# Patient Record
Sex: Female | Born: 1960 | Race: White | Hispanic: No | Marital: Single | Smoking: Never smoker
Health system: Southern US, Community
[De-identification: ages and names within clinical notes are randomized; demographics above are authoritative.]

## PROBLEM LIST (undated history)

## (undated) DIAGNOSIS — I10 Essential (primary) hypertension: Secondary | ICD-10-CM

---

## 2020-01-11 ENCOUNTER — Emergency Department
Admission: EM | Admit: 2020-01-11 | Discharge: 2020-01-11 | Disposition: A | Discharge status: Home / Self Care | Payer: No Typology Code available for payment source | Attending: Emergency Medicine | Admitting: Emergency Medicine

## 2020-01-11 ENCOUNTER — Emergency Department: Payer: No Typology Code available for payment source

## 2020-01-11 ENCOUNTER — Other Ambulatory Visit: Payer: Self-pay

## 2020-01-11 DIAGNOSIS — Y999 Unspecified external cause status: Secondary | ICD-10-CM | POA: Diagnosis not present

## 2020-01-11 DIAGNOSIS — Y929 Unspecified place or not applicable: Secondary | ICD-10-CM | POA: Insufficient documentation

## 2020-01-11 DIAGNOSIS — Y92410 Unspecified street and highway as the place of occurrence of the external cause: Secondary | ICD-10-CM | POA: Diagnosis not present

## 2020-01-11 DIAGNOSIS — R0789 Other chest pain: Secondary | ICD-10-CM | POA: Diagnosis not present

## 2020-01-11 DIAGNOSIS — Y9389 Activity, other specified: Secondary | ICD-10-CM | POA: Insufficient documentation

## 2020-01-11 MED ORDER — ACETAMINOPHEN 160 MG/5ML PO SOLN
500.0000 mg | Freq: Once | ORAL | Status: AC
  Administered 2020-01-11: 23:00:00 500 mg via ORAL
  Filled 2020-01-11: qty 20.3

## 2020-01-11 NOTE — Discharge Instructions (Signed)
Take Tylenol at home for discomfort.
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
## 2022-03-01 ENCOUNTER — Telehealth: Payer: Self-pay | Admitting: Gerontology

## 2022-03-01 NOTE — Telephone Encounter (Signed)
Schedule a new patient appointment on 6/28 at 1pm. Unable to leave a message. Patient voicemail box is full.

## 2022-03-09 ENCOUNTER — Ambulatory Visit: Payer: Self-pay | Admitting: Gerontology

## 2022-04-05 ENCOUNTER — Ambulatory Visit: Payer: Self-pay | Admitting: Gerontology

## 2024-05-16 ENCOUNTER — Emergency Department

## 2024-05-16 ENCOUNTER — Inpatient Hospital Stay
Admission: EM | Admit: 2024-05-16 | Discharge: 2024-05-23 | DRG: 641 | Disposition: A | Discharge status: Skilled Nursing Facility | Attending: Internal Medicine | Admitting: Internal Medicine

## 2024-05-16 ENCOUNTER — Other Ambulatory Visit: Payer: Self-pay

## 2024-05-16 DIAGNOSIS — N179 Acute kidney failure, unspecified: Secondary | ICD-10-CM | POA: Diagnosis present

## 2024-05-16 DIAGNOSIS — C50919 Malignant neoplasm of unspecified site of unspecified female breast: Secondary | ICD-10-CM | POA: Diagnosis present

## 2024-05-16 DIAGNOSIS — E8809 Other disorders of plasma-protein metabolism, not elsewhere classified: Secondary | ICD-10-CM | POA: Diagnosis present

## 2024-05-16 DIAGNOSIS — L97519 Non-pressure chronic ulcer of other part of right foot with unspecified severity: Secondary | ICD-10-CM | POA: Diagnosis present

## 2024-05-16 DIAGNOSIS — E871 Hypo-osmolality and hyponatremia: Secondary | ICD-10-CM | POA: Diagnosis present

## 2024-05-16 DIAGNOSIS — L97529 Non-pressure chronic ulcer of other part of left foot with unspecified severity: Secondary | ICD-10-CM | POA: Diagnosis present

## 2024-05-16 DIAGNOSIS — Z560 Unemployment, unspecified: Secondary | ICD-10-CM | POA: Diagnosis not present

## 2024-05-16 DIAGNOSIS — R627 Adult failure to thrive: Secondary | ICD-10-CM | POA: Diagnosis present

## 2024-05-16 DIAGNOSIS — R7989 Other specified abnormal findings of blood chemistry: Secondary | ICD-10-CM | POA: Diagnosis present

## 2024-05-16 DIAGNOSIS — L0390 Cellulitis, unspecified: Secondary | ICD-10-CM

## 2024-05-16 DIAGNOSIS — D649 Anemia, unspecified: Secondary | ICD-10-CM | POA: Diagnosis present

## 2024-05-16 DIAGNOSIS — R591 Generalized enlarged lymph nodes: Secondary | ICD-10-CM | POA: Diagnosis present

## 2024-05-16 DIAGNOSIS — E876 Hypokalemia: Secondary | ICD-10-CM | POA: Diagnosis present

## 2024-05-16 DIAGNOSIS — K219 Gastro-esophageal reflux disease without esophagitis: Secondary | ICD-10-CM | POA: Diagnosis present

## 2024-05-16 DIAGNOSIS — E8729 Other acidosis: Secondary | ICD-10-CM | POA: Diagnosis present

## 2024-05-16 DIAGNOSIS — I10 Essential (primary) hypertension: Secondary | ICD-10-CM | POA: Diagnosis present

## 2024-05-16 DIAGNOSIS — D509 Iron deficiency anemia, unspecified: Secondary | ICD-10-CM | POA: Diagnosis present

## 2024-05-16 DIAGNOSIS — R531 Weakness: Principal | ICD-10-CM

## 2024-05-16 DIAGNOSIS — L304 Erythema intertrigo: Secondary | ICD-10-CM | POA: Diagnosis present

## 2024-05-16 DIAGNOSIS — K449 Diaphragmatic hernia without obstruction or gangrene: Secondary | ICD-10-CM | POA: Diagnosis present

## 2024-05-16 DIAGNOSIS — D72829 Elevated white blood cell count, unspecified: Secondary | ICD-10-CM | POA: Diagnosis present

## 2024-05-16 DIAGNOSIS — T730XXA Starvation, initial encounter: Secondary | ICD-10-CM | POA: Diagnosis present

## 2024-05-16 DIAGNOSIS — R630 Anorexia: Secondary | ICD-10-CM | POA: Diagnosis present

## 2024-05-16 HISTORY — DX: Essential (primary) hypertension: I10

## 2024-05-16 LAB — BETA-HYDROXYBUTYRIC ACID: Beta-Hydroxybutyric Acid: 5.07 mmol/L — ABNORMAL HIGH (ref 0.05–0.27)

## 2024-05-16 LAB — COMPREHENSIVE METABOLIC PANEL WITH GFR
ALT: 17 U/L (ref 0–44)
AST: 33 U/L (ref 15–41)
Albumin: 2.8 g/dL — ABNORMAL LOW (ref 3.5–5.0)
Alkaline Phosphatase: 71 U/L (ref 38–126)
Anion gap: 23 — ABNORMAL HIGH (ref 5–15)
BUN: 32 mg/dL — ABNORMAL HIGH (ref 8–23)
CO2: 14 mmol/L — ABNORMAL LOW (ref 22–32)
Calcium: 8.8 mg/dL — ABNORMAL LOW (ref 8.9–10.3)
Chloride: 95 mmol/L — ABNORMAL LOW (ref 98–111)
Creatinine, Ser: 1.45 mg/dL — ABNORMAL HIGH (ref 0.44–1.00)
GFR, Estimated: 41 mL/min — ABNORMAL LOW (ref >60)
Glucose, Bld: 91 mg/dL (ref 70–99)
Potassium: 4.5 mmol/L (ref 3.5–5.1)
Sodium: 132 mmol/L — ABNORMAL LOW (ref 135–145)
Total Bilirubin: 1.6 mg/dL — ABNORMAL HIGH (ref 0.0–1.2)
Total Protein: 7.5 g/dL (ref 6.5–8.1)

## 2024-05-16 LAB — BLOOD GAS, VENOUS
Acid-base deficit: 5.2 mmol/L — ABNORMAL HIGH (ref 0.0–2.0)
Bicarbonate: 18.9 mmol/L — ABNORMAL LOW (ref 20.0–28.0)
O2 Saturation: 19.6 %
Patient temperature: 37
pCO2, Ven: 32 mmHg — ABNORMAL LOW (ref 44–60)
pH, Ven: 7.38 (ref 7.25–7.43)
pO2, Ven: <31 mmHg — CL (ref 32–45)

## 2024-05-16 LAB — TROPONIN I (HIGH SENSITIVITY)
Troponin I (High Sensitivity): 57 ng/L — ABNORMAL HIGH (ref <18)
Troponin I (High Sensitivity): 64 ng/L — ABNORMAL HIGH (ref <18)

## 2024-05-16 LAB — LACTIC ACID, PLASMA
Lactic Acid, Venous: 1.4 mmol/L (ref 0.5–1.9)
Lactic Acid, Venous: 1.7 mmol/L (ref 0.5–1.9)

## 2024-05-16 LAB — CBC
HCT: 27.1 % — ABNORMAL LOW (ref 36.0–46.0)
Hemoglobin: 7.5 g/dL — ABNORMAL LOW (ref 12.0–15.0)
MCH: 19.9 pg — ABNORMAL LOW (ref 26.0–34.0)
MCHC: 27.7 g/dL — ABNORMAL LOW (ref 30.0–36.0)
MCV: 72.1 fL — ABNORMAL LOW (ref 80.0–100.0)
Platelets: 631 K/uL — ABNORMAL HIGH (ref 150–400)
RBC: 3.76 MIL/uL — ABNORMAL LOW (ref 3.87–5.11)
RDW: 19.9 % — ABNORMAL HIGH (ref 11.5–15.5)
WBC: 17.2 K/uL — ABNORMAL HIGH (ref 4.0–10.5)
nRBC: 0 % (ref 0.0–0.2)

## 2024-05-16 LAB — TSH: TSH: 1.978 u[IU]/mL (ref 0.350–4.500)

## 2024-05-16 LAB — MAGNESIUM: Magnesium: 2 mg/dL (ref 1.7–2.4)

## 2024-05-16 LAB — PROTIME-INR
INR: 1.1 (ref 0.8–1.2)
Prothrombin Time: 15 s (ref 11.4–15.2)

## 2024-05-16 LAB — T4, FREE: Free T4: 1.28 ng/dL — ABNORMAL HIGH (ref 0.61–1.12)

## 2024-05-16 MED ORDER — SODIUM CHLORIDE 0.9 % IV SOLN
2.0000 g | Freq: Once | INTRAVENOUS | Status: AC
  Administered 2024-05-16: 2 g via INTRAVENOUS
  Filled 2024-05-16: qty 12.5

## 2024-05-16 MED ORDER — ONDANSETRON HCL 4 MG/2ML IJ SOLN: 4.0000 mg | Freq: Four times a day (QID) | INTRAMUSCULAR | Status: DC | PRN

## 2024-05-16 MED ORDER — DEXTROSE-SODIUM CHLORIDE 5-0.9 % IV SOLN: INTRAVENOUS | Status: DC

## 2024-05-16 MED ORDER — SODIUM CHLORIDE 0.9 % IV SOLN
2.0000 g | INTRAVENOUS | Status: DC
  Administered 2024-05-16 – 2024-05-19 (×4): 2 g via INTRAVENOUS
  Filled 2024-05-16 (×4): qty 20

## 2024-05-16 MED ORDER — FOLIC ACID 1 MG PO TABS
1.0000 mg | ORAL_TABLET | Freq: Every day | ORAL | Status: DC
  Administered 2024-05-16 – 2024-05-23 (×7): 1 mg via ORAL
  Filled 2024-05-16 (×7): qty 1

## 2024-05-16 MED ORDER — ONDANSETRON HCL 4 MG PO TABS: 4.0000 mg | ORAL_TABLET | Freq: Four times a day (QID) | ORAL | Status: DC | PRN

## 2024-05-16 MED ORDER — METRONIDAZOLE 500 MG/100ML IV SOLN
500.0000 mg | Freq: Once | INTRAVENOUS | Status: AC
  Administered 2024-05-16: 500 mg via INTRAVENOUS
  Filled 2024-05-16: qty 100

## 2024-05-16 MED ORDER — ENOXAPARIN SODIUM 40 MG/0.4ML IJ SOSY
40.0000 mg | PREFILLED_SYRINGE | INTRAMUSCULAR | Status: DC
  Administered 2024-05-16: 40 mg via SUBCUTANEOUS
  Filled 2024-05-16: qty 0.4

## 2024-05-16 MED ORDER — THIAMINE HCL 100 MG/ML IJ SOLN
100.0000 mg | Freq: Once | INTRAMUSCULAR | Status: AC
  Administered 2024-05-16: 100 mg via INTRAVENOUS
  Filled 2024-05-16: qty 2

## 2024-05-16 MED ORDER — IOHEXOL 350 MG/ML SOLN
80.0000 mL | Freq: Once | INTRAVENOUS | Status: AC | PRN
  Administered 2024-05-16: 80 mL via INTRAVENOUS

## 2024-05-16 MED ORDER — ADULT MULTIVITAMIN W/MINERALS CH
1.0000 | ORAL_TABLET | Freq: Every day | ORAL | Status: DC
  Administered 2024-05-16 – 2024-05-23 (×7): 1 via ORAL
  Filled 2024-05-16 (×7): qty 1

## 2024-05-16 MED ORDER — METRONIDAZOLE 500 MG/100ML IV SOLN
500.0000 mg | Freq: Two times a day (BID) | INTRAVENOUS | Status: DC
  Administered 2024-05-17: 500 mg via INTRAVENOUS
  Filled 2024-05-16 (×2): qty 100

## 2024-05-16 MED ORDER — DEXTROSE 5 % AND 0.9 % NACL IV BOLUS
500.0000 mL | Freq: Once | INTRAVENOUS | Status: AC
  Administered 2024-05-16: 500 mL via INTRAVENOUS

## 2024-05-16 MED ORDER — VANCOMYCIN HCL IN DEXTROSE 1-5 GM/200ML-% IV SOLN: 1000.0000 mg | Freq: Once | INTRAVENOUS | Status: DC

## 2024-05-16 MED ORDER — MORPHINE SULFATE (PF) 2 MG/ML IV SOLN
2.0000 mg | INTRAVENOUS | Status: DC | PRN
  Filled 2024-05-16: qty 1

## 2024-05-16 NOTE — ED Triage Notes (Signed)
 Patient to ED initially for neck pain stating she has been sleeping in a wooden chair; during triage patient experienced that she has been very weak recently and community advocate reports that patient has not been eating or drinking.

## 2024-05-16 NOTE — H&P (Signed)
 History and Physical    Patient: Cathy Grant DOB: 01-Jan-1961 DOA: 05/16/2024 DOS: the patient was seen and examined on 05/16/2024 PCP: Patient, No Pcp Per  Patient coming from: Home  Chief Complaint:  Chief Complaint  Patient presents with   Weakness   HPI: Cathy Grant is a 63 y.o. female with medical history significant of essential hypertension, suspected breast cancer, GERD who has not been on any medications at home but has apparently been gradually declining not eating or drinking for weeks.  She reports all foods do not taste right so she is cut down on her intake.  She came to the ER complaint of neck pain because she has been sitting in the chair.  No abdominal pain.  She had nausea but no vomiting no diarrhea.  Patient was seen and evaluated in the ER.  Patient found to have ketoacidosis, multiple electrolyte abnormalities and anemia.  She appears to have starvation ketosis.  Her imaging showed severe hiatal hernia with the anterior stomach move into the chest cavity.  Patient also has some AKI, elevated troponin, leukocytosis among other things.  CT abdomen pelvis also showed possible enterocolitis.  Patient also has diffuse axillary and subpectoral adenopathy no obvious breast mass.  Patient has been admitted for further evaluation and treatment.  Review of Systems: As mentioned in the history of present illness. All other systems reviewed and are negative. Past Medical History:  Diagnosis Date   Hypertension    History reviewed. No pertinent surgical history. Social History:  reports that she has never smoked. She has never used smokeless tobacco. She reports that she does not drink alcohol and does not use drugs.  No Known Allergies  History reviewed. No pertinent family history.  Prior to Admission medications   Not on File    Physical Exam: Vitals:   05/16/24 1149 05/16/24 1430 05/16/24 1530 05/16/24 1600  BP: 136/80 (!) 100/56 100/62 (!) 91/55   Pulse: (!) 109 100 95 100  Resp: 18  18 18   Temp: 97.8 F (36.6 C)     TempSrc: Oral     SpO2: 100% 100% 100% 100%   Constitutional: Chronically ill looking, NAD, calm, comfortable Eyes: PERRL, lids and conjunctivae normal ENMT: Mucous membranes are dry. Posterior pharynx clear of any exudate or lesions.Normal dentition.  Neck: normal, supple, no masses, no thyromegaly Respiratory: clear to auscultation bilaterally, no wheezing, no crackles. Normal respiratory effort. No accessory muscle use.  Cardiovascular: Sinus tachycardia, no murmurs / rubs / gallops. No extremity edema. 2+ pedal pulses. No carotid bruits.  Abdomen: no tenderness, no masses palpated. No hepatosplenomegaly. Bowel sounds positive.  Musculoskeletal: Good range of motion, no joint swelling or tenderness, Skin: no rashes, lesions, ulcers. No induration Neurologic: CN 2-12 grossly intact. Sensation intact, DTR normal. Strength 5/5 in all 4.  Psychiatric: Normal judgment and insight. Alert and oriented x 3. Normal mood  Data Reviewed:  Temperature 97.9, blood pressure 90/55, pulse 109, white count 17.2 hemoglobin 7.5 and platelets 631.  Sodium 132, BUN 32 creatinine 1.45 and chloride 95.  Calcium 8.8.  Beta-hydroxybutyrate acid 5.07.  INR 1.1 T4 is 1.28 but TSH 1.978.  EKG showed sinus tachycardia x-ray of the right leg showed plantar dislocation of the second digit middle phalanx at the PIP joint okay also metatarsus primus versus and hallux valgus, x-ray of the left foot shows no radiographic evidence of acute osteomyelitis many contractures CT abdomen pelvis shows fluid throughout the small bowel and colon with occasional areas  of enhancement.  Findings may represent enterocolitis.  Also large hiatal hernia with entire stomach intrathoracic.  This also contains pancreatic body and tail.  There is skin thickening and increased density in the inferior left breast.  Mammography recommended.  Also posterior uterine body mass  measures 7 x 7.6 x 5.6 cm typical of fibroid.  There are multiple cysts subcapsular hypodensity in the lateral spleen head CT without contrast showed no acute findings  Assessment and Plan:  #1 starvation ketosis: Patient has not being having adequate oral intake as a result of probably severe hiatal hernia with anterior stomach currently in the thoracic cavity.  She is not having significant respiratory problems.  Patient will be admitted.  Initiate D5 normal saline.  Encourage increased oral intake.  We will address the hiatal hernia.  Continue with close monitoring.  #2 severe hiatal hernia: Patient may require surgical consult in the morning.  PPIs for now.  Her inability to eat adequately could be related to the hiatal hernia.  #3 microcytic anemia: Could be nutritional.  Hemoglobin 7.5.  Follow closely.  Will transfuse if hemoglobin drops to less than 7.  #4 leukocytosis: Suspicion for infectious source including possible cellulitis versus osteomyelitis.  Initiate empiric antibiotics.  #5 essential hypertension: Not on any blood pressure medications.  Will initiate medications if the blood pressure rises.  #6 possible metastatic breast cancer: Patient will need mammogram and possible biopsy.  Diffuse lymphadenopathy could be due to other causes as well.  #7 AKI: Probably prerenal.  Hydrate and monitor  #8 hyponatremia: Continue saline and monitor sodium  #9 failure to thrive in adult: Due to poor oral intake.  Continue to monitor and improve nutrition.    Advance Care Planning:   Code Status: Full Code   Consults: None at the moment but will need surgical consult probably  Family Communication: Daughter and granddaughter at bedside  Severity of Illness: The appropriate patient status for this patient is INPATIENT. Inpatient status is judged to be reasonable and necessary in order to provide the required intensity of service to ensure the patient's safety. The patient's presenting  symptoms, physical exam findings, and initial radiographic and laboratory data in the context of their chronic comorbidities is felt to place them at high risk for further clinical deterioration. Furthermore, it is not anticipated that the patient will be medically stable for discharge from the hospital within 2 midnights of admission.   * I certify that at the point of admission it is my clinical judgment that the patient will require inpatient hospital care spanning beyond 2 midnights from the point of admission due to high intensity of service, high risk for further deterioration and high frequency of surveillance required.*  AuthorBETHA SIM KNOLL, MD 05/16/2024 6:46 PM  For on call review [email or web address].

## 2024-05-16 NOTE — ED Provider Notes (Signed)
 Clinical Course as of 05/16/24 2232  Thu May 16, 2024  1501 Received sign out, general weakness, will need admission. Concern for insiduous malignancy.  [HD]  1610 GFR, Estimated(!): 41 [HD]  1610 Patient is receiving a small amount of fluid with D5 [HD]  1817 CT ABDOMEN PELVIS W CONTRAST Possible breast cancer [HD]  1817 CT Angio Chest PE W and/or Wo Contrast Metastatic disease [HD]  1835 Admitted to hospitalist [HD]    Clinical Course User Index [HD] Nicholaus Rolland BRAVO, MD      Nicholaus Rolland BRAVO, MD 05/16/24 2232

## 2024-05-16 NOTE — ED Notes (Signed)
 Pt placed in clean gown and mesh undergarments all pt clothing placed in bag.

## 2024-05-16 NOTE — ED Notes (Signed)
 Patient crying in triage and requesting not to have blood drawn at this time; wishes to wait for IV stick.

## 2024-05-16 NOTE — ED Provider Notes (Signed)
 [facility] Provider Note    Event Date/Time   First MD Initiated Contact with Patient 05/16/24 1308     (approximate)   History   Weakness   HPI  Cathy Grant is a 63 y.o. female who comes in with weakness.  Patient reports decreased p.o. intake.  Patient reports not being on any medications.  She states that she just has not been able to eat or drink over the past few days, weeks.  She reports that the food just does not taste right.  She does report hitting her head and hurting her neck and a few days ago and now feeling like her neck is bent forward.  She reports no severe cough or belly pain.     Physical Exam   Triage Vital Signs: ED Triage Vitals [05/16/24 1149]  Encounter Vitals Group     BP 136/80     Girls Systolic BP Percentile      Girls Diastolic BP Percentile      Boys Systolic BP Percentile      Boys Diastolic BP Percentile      Pulse Rate (!) 109     Resp 18     Temp 97.8 F (36.6 C)     Temp Source Oral     SpO2 100 %     Weight      Height      Head Circumference      Peak Flow      Pain Score 0     Pain Loc      Pain Education      Exclude from Growth Chart     Most recent vital signs: Vitals:   05/16/24 1149  BP: 136/80  Pulse: (!) 109  Resp: 18  Temp: 97.8 F (36.6 C)  SpO2: 100%     General: Awake, no distress.  CV:  Good peripheral perfusion.  Resp:  Normal effort.  Abd:  No distention.  Soft and nontender Other:  Kyphosis noted of her neck.  With some tenderness along her C-spine. Patient has some wounds noted on her bilateral feet little bit of drainage noted.  She is a little bit of redness noted as well.  She got 2+ edema.  ED Results / Procedures / Treatments   Labs (all labs ordered are listed, but only abnormal results are displayed) Labs Reviewed  COMPREHENSIVE METABOLIC PANEL WITH GFR  CBC  URINALYSIS, ROUTINE W REFLEX MICROSCOPIC  CBG MONITORING, ED     EKG  My interpretation  of EKG:  Sinus tachycardia rate of 111 without any ST elevation or T wave versions, normal intervals  Radiology : pending   PROCEDURES:  Critical Care performed: Yes, see critical care procedure note(s)  .1-3 Lead EKG Interpretation  Performed by: Ernest Ronal BRAVO, MD Authorized by: Ernest Ronal BRAVO, MD     Interpretation: abnormal     ECG rate:  100   ECG rate assessment: tachycardic     Rhythm: sinus tachycardia     Ectopy: none     Conduction: normal   .Critical Care  Performed by: Ernest Ronal BRAVO, MD Authorized by: Ernest Ronal BRAVO, MD   Critical care provider statement:    Critical care time (minutes):  30   Critical care was necessary to treat or prevent imminent or life-threatening deterioration of the following conditions:  Sepsis   Critical care was time spent personally by me on the following activities:  Development of treatment plan with  patient or surrogate, discussions with consultants, evaluation of patient's response to treatment, examination of patient, ordering and review of laboratory studies, ordering and review of radiographic studies, ordering and performing treatments and interventions, pulse oximetry, re-evaluation of patient's condition and review of old charts    MEDICATIONS ORDERED IN ED: Medications - No data to display   IMPRESSION / MDM / ASSESSMENT AND PLAN / ED COURSE  I reviewed the triage vital signs and the nursing notes.   Patient's presentation is most consistent with acute presentation with potential threat to life or bodily function.    Patient comes in with failure to thrive in a 63 year old.  Patient appears very cachectic with wounds noted on her feet and significant edema.  Patient met sepsis criteria with elevated heart rate and elevated white count.  Will cover patient with antibiotics for possible osteomyelitis, cellulitis.  X-rays ordered to evaluate the feet.  Patient's thyroid slightly elevated but unlikely to be causing the symptoms.   Her CMP does show significantly low bicarb with elevated anion gap although her glucose was normal at 90.  I am concerned about a starvation ketosis.  Her albumin level is very low which would be consistent with her swelling.  Her hemoglobin is also low at 7.5.  Given she reports trauma to her head and neck pain I will get CT imaging to evaluate for head and neck from us  concern the possibility of occult cancer and will get CTs of her chest abdomen pelvis.  Do feel like patient will require admission to the hospital after these results  Patient denies any alcohol use but out of precaution I will give her a dose of thiamine  before starting her on D5 normal saline.  Patient handed off to oncoming team pending CT imaging and admission to the hospital  The patient is on the cardiac monitor to evaluate for evidence of arrhythmia and/or significant heart rate changes.  Clinical Course as of 05/16/24 1516  Thu May 16, 2024  1501 Received sign out, general weakness, will need admission. Concern for insiduous malignancy.  [HD]    Clinical Course User Index [HD] Nicholaus Rolland BRAVO, MD     FINAL CLINICAL IMPRESSION(S) / ED DIAGNOSES   Final diagnoses:  Weakness  Cellulitis, unspecified cellulitis site     Rx / DC Orders   ED Discharge Orders     None        Note:  This document was prepared using Dragon voice recognition software and may include unintentional dictation errors.   Ernest Ronal BRAVO, MD 05/16/24 [id number]
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
## 2024-05-16 NOTE — ED Notes (Signed)
 Pt remains in CT

## 2024-05-16 NOTE — Consult Note (Signed)
 CODE SEPSIS - PHARMACY COMMUNICATION  **Broad Spectrum Antibiotics should be administered within 1 hour of Sepsis diagnosis**  Time Code Sepsis Called/Page Received: 1519  Antibiotics Ordered: Cefepime  2G x1, Metronidazole  500 mg x1, Vancomycin  1G x1   Time of 1st antibiotic administration: 1714  Additional action taken by pharmacy: Messaged RN, she states they are waiting on IV team to get access draw BC so we can start antibiotics.   Annabella LOISE Banks, PharmD Clinical Pharmacist  05/16/2024  3:26 PM

## 2024-05-17 DIAGNOSIS — E876 Hypokalemia: Secondary | ICD-10-CM

## 2024-05-17 DIAGNOSIS — L0390 Cellulitis, unspecified: Secondary | ICD-10-CM | POA: Diagnosis not present

## 2024-05-17 DIAGNOSIS — R627 Adult failure to thrive: Secondary | ICD-10-CM | POA: Diagnosis not present

## 2024-05-17 DIAGNOSIS — N179 Acute kidney failure, unspecified: Secondary | ICD-10-CM | POA: Diagnosis not present

## 2024-05-17 DIAGNOSIS — E871 Hypo-osmolality and hyponatremia: Secondary | ICD-10-CM

## 2024-05-17 DIAGNOSIS — T730XXA Starvation, initial encounter: Secondary | ICD-10-CM | POA: Diagnosis not present

## 2024-05-17 LAB — COMPREHENSIVE METABOLIC PANEL WITH GFR
ALT: 12 U/L (ref 0–44)
AST: 20 U/L (ref 15–41)
Albumin: 1.8 g/dL — ABNORMAL LOW (ref 3.5–5.0)
Alkaline Phosphatase: 54 U/L (ref 38–126)
Anion gap: 9 (ref 5–15)
BUN: 24 mg/dL — ABNORMAL HIGH (ref 8–23)
CO2: 22 mmol/L (ref 22–32)
Calcium: 7.5 mg/dL — ABNORMAL LOW (ref 8.9–10.3)
Chloride: 101 mmol/L (ref 98–111)
Creatinine, Ser: 1.19 mg/dL — ABNORMAL HIGH (ref 0.44–1.00)
GFR, Estimated: 51 mL/min — ABNORMAL LOW (ref >60)
Glucose, Bld: 110 mg/dL — ABNORMAL HIGH (ref 70–99)
Potassium: 2.8 mmol/L — ABNORMAL LOW (ref 3.5–5.1)
Sodium: 132 mmol/L — ABNORMAL LOW (ref 135–145)
Total Bilirubin: 0.6 mg/dL (ref 0.0–1.2)
Total Protein: 5.3 g/dL — ABNORMAL LOW (ref 6.5–8.1)

## 2024-05-17 LAB — ABO/RH: ABO/RH(D): O POS

## 2024-05-17 LAB — CBC
HCT: 18.7 % — ABNORMAL LOW (ref 36.0–46.0)
Hemoglobin: 5.5 g/dL — ABNORMAL LOW (ref 12.0–15.0)
MCH: 20.3 pg — ABNORMAL LOW (ref 26.0–34.0)
MCHC: 29.4 g/dL — ABNORMAL LOW (ref 30.0–36.0)
MCV: 69 fL — ABNORMAL LOW (ref 80.0–100.0)
Platelets: 523 K/uL — ABNORMAL HIGH (ref 150–400)
RBC: 2.71 MIL/uL — ABNORMAL LOW (ref 3.87–5.11)
RDW: 19.4 % — ABNORMAL HIGH (ref 11.5–15.5)
WBC: 8.5 K/uL (ref 4.0–10.5)
nRBC: 0 % (ref 0.0–0.2)

## 2024-05-17 LAB — IRON AND TIBC
Iron: <10 ug/dL — ABNORMAL LOW (ref 28–170)
TIBC: 211 ug/dL — ABNORMAL LOW (ref 250–450)

## 2024-05-17 LAB — FOLATE: Folate: 5.8 ng/mL — ABNORMAL LOW (ref >5.9)

## 2024-05-17 LAB — HIV ANTIBODY (ROUTINE TESTING W REFLEX): HIV Screen 4th Generation wRfx: NONREACTIVE

## 2024-05-17 LAB — PREPARE RBC (CROSSMATCH)

## 2024-05-17 MED ORDER — NYSTATIN 100000 UNIT/GM EX POWD
Freq: Three times a day (TID) | CUTANEOUS | Status: DC
  Filled 2024-05-17: qty 15

## 2024-05-17 MED ORDER — SODIUM CHLORIDE 0.9% IV SOLUTION: Freq: Once | INTRAVENOUS | Status: AC

## 2024-05-17 MED ORDER — TRAMADOL HCL 50 MG PO TABS
50.0000 mg | ORAL_TABLET | Freq: Four times a day (QID) | ORAL | Status: DC | PRN
  Administered 2024-05-22: 50 mg via ORAL
  Filled 2024-05-17: qty 1

## 2024-05-17 MED ORDER — ACETAMINOPHEN 325 MG PO TABS: 650.0000 mg | ORAL_TABLET | Freq: Four times a day (QID) | ORAL | Status: DC | PRN

## 2024-05-17 MED ORDER — MEDIHONEY WOUND/BURN DRESSING EX PSTE
1.0000 | PASTE | Freq: Every day | CUTANEOUS | Status: DC
  Administered 2024-05-18 – 2024-05-23 (×6): 1 via TOPICAL
  Filled 2024-05-17: qty 44

## 2024-05-17 MED ORDER — FOLIC ACID 1 MG PO TABS: 1.0000 mg | ORAL_TABLET | Freq: Every day | ORAL | Status: DC

## 2024-05-17 MED ORDER — THIAMINE MONONITRATE 100 MG PO TABS
100.0000 mg | ORAL_TABLET | Freq: Every day | ORAL | Status: DC
  Administered 2024-05-17 – 2024-05-23 (×6): 100 mg via ORAL
  Filled 2024-05-17 (×6): qty 1

## 2024-05-17 MED ORDER — MORPHINE SULFATE (PF) 2 MG/ML IV SOLN: 2.0000 mg | INTRAVENOUS | Status: DC | PRN

## 2024-05-17 MED ORDER — SENNOSIDES-DOCUSATE SODIUM 8.6-50 MG PO TABS
2.0000 | ORAL_TABLET | Freq: Every day | ORAL | Status: DC
  Administered 2024-05-17 – 2024-05-18 (×2): 2 via ORAL
  Filled 2024-05-17 (×6): qty 2

## 2024-05-17 MED ORDER — POLYSACCHARIDE IRON COMPLEX 150 MG PO CAPS
150.0000 mg | ORAL_CAPSULE | Freq: Every day | ORAL | Status: DC
  Administered 2024-05-17 – 2024-05-23 (×5): 150 mg via ORAL
  Filled 2024-05-17 (×7): qty 1

## 2024-05-17 MED ORDER — PANTOPRAZOLE SODIUM 40 MG PO TBEC
40.0000 mg | DELAYED_RELEASE_TABLET | Freq: Every day | ORAL | Status: DC
  Administered 2024-05-17 – 2024-05-23 (×6): 40 mg via ORAL
  Filled 2024-05-17 (×6): qty 1

## 2024-05-17 MED ORDER — IRON SUCROSE 200 MG IVPB - SIMPLE MED
200.0000 mg | Freq: Every day | Status: DC
  Administered 2024-05-17 – 2024-05-19 (×2): 200 mg via INTRAVENOUS
  Filled 2024-05-17 (×2): qty 200
  Filled 2024-05-17: qty 110

## 2024-05-17 MED ORDER — POTASSIUM CHLORIDE CRYS ER 20 MEQ PO TBCR
40.0000 meq | EXTENDED_RELEASE_TABLET | Freq: Once | ORAL | Status: AC
  Administered 2024-05-17: 40 meq via ORAL
  Filled 2024-05-17: qty 2

## 2024-05-17 MED ORDER — POTASSIUM CHLORIDE 10 MEQ/100ML IV SOLN
10.0000 meq | INTRAVENOUS | Status: AC
  Administered 2024-05-17 (×2): 10 meq via INTRAVENOUS
  Filled 2024-05-17: qty 100

## 2024-05-17 MED ORDER — FLUCONAZOLE 100 MG PO TABS
100.0000 mg | ORAL_TABLET | Freq: Every day | ORAL | Status: DC
  Administered 2024-05-17 – 2024-05-23 (×6): 100 mg via ORAL
  Filled 2024-05-17 (×6): qty 1

## 2024-05-17 NOTE — Progress Notes (Signed)
 [facility]  - [place] at [facility]   PATIENT NAME: Cathy Grant    MR#:  [id number]  DATE OF BIRTH:  [birth year]  SUBJECTIVE:  no family at bedside patient came in with increasing weakness. She has history of hypertension and Thalia has not been on any medications. Tells me she is from our group home. Poor appetite. Found to have significant electrolyte abnormalities along with low hemoglobin. Denies any diarrhea today. Jess had run a stool yesterday. Tells me she is not able to care for herself.    VITALS:  Blood pressure (!) 82/50, pulse 85, temperature 98 F (36.7 C), resp. rate 16, height 5' 2 (1.575 m), weight 74.8 kg, SpO2 100%.  PHYSICAL EXAMINATION:   GENERAL:  63 y.o.-year-old patient with no acute distress. obese LUNGS: Normal breath sounds bilaterally, no wheezing CARDIOVASCULAR: S1, S2 normal. No murmur   ABDOMEN: Soft, nontender, nondistended. Bowel sounds present.       EXTREMITIES: On admission    NEUROLOGIC: nonfocal  patient is alert and awake SKIN: as above  LABORATORY PANEL:  CBC Recent Labs  Lab 05/17/24 0530  WBC 8.5  HGB 5.5*  HCT 18.7*  PLT 523*    Chemistries  Recent Labs  Lab 05/16/24 1340 05/17/24 0530  NA 132* 132*  K 4.5 2.8*  CL 95* 101  CO2 14* 22  GLUCOSE 91 110*  BUN 32* 24*  CREATININE 1.45* 1.19*  CALCIUM 8.8* 7.5*  MG 2.0  --   AST 33 20  ALT 17 12  ALKPHOS 71 54  BILITOT 1.6* 0.6   Cardiac Enzymes No results for input(s): TROPONINI in the last 168 hours. RADIOLOGY:  CT ABDOMEN PELVIS W CONTRAST Result Date: 05/16/2024 CLINICAL DATA:  Provided history: Abdominal pain, acute, nonlocalized Eighty weakness.  Decreased p.o. intake. EXAM: CT ABDOMEN AND PELVIS WITH CONTRAST TECHNIQUE: Multidetector CT imaging of the abdomen and pelvis was performed using the standard protocol following bolus administration of intravenous contrast. RADIATION DOSE REDUCTION: This exam was performed according to the  departmental dose-optimization program which includes automated exposure control, adjustment of the mA and/or kV according to patient size and/or use of iterative reconstruction technique. CONTRAST:  80mL OMNIPAQUE  IOHEXOL  350 MG/ML SOLN COMPARISON:  None Available. FINDINGS: Lower chest: Assessed on concurrent chest CT, reported separately. Hepatobiliary: Motion artifact limitations. Question hepatic steatosis. No evidence of hepatic lesion. Partially distended gallbladder, motion obscured. No calcified gallstone. No biliary dilatation. Pancreas: No ductal dilatation or inflammation. The distal body and tail of the pancreas extend into large hiatal hernia. No obvious pancreatic mass, motion obscures the pancreatic head. Spleen: No splenomegaly. 8 mm subcapsular hypodensity in the lateral spleen series 2, image 24, partially motion obscured. Adrenals/Urinary Tract: No adrenal nodule. No hydronephrosis or renal calculi. Bilateral parapelvic cysts. No evidence of renal inflammation or suspicious renal lesion. Unremarkable urinary bladder. Stomach/Bowel: Bowel assessment is limited in the absence of enteric contrast, as well as motion artifact in the upper abdomen. Large hiatal hernia, the entire stomach is intrathoracic. Fluid within nondilated small bowel. There is fluid throughout the colon. Occasional areas of small bowel and colonic enhancement suspected, but no convincing wall thickening. Normal appendix. Vascular/Lymphatic: Normal caliber abdominal aorta. Portal vein is patent. No enlarged lymph nodes in the abdomen or pelvis. All lymph nodes are subcentimeter short axis and not enlarged by size criteria. Reproductive: Posterior uterine body mass measuring 7 x 7.6 x 5.6 cm typical of fibroid, although nonspecific. Atrophic ovaries, normal for age. Other: No  ascites. No free air or omental thickening. No abdominal wall or inguinal hernia. There is skin thickening and increased density in the inferior left breast,  series 2, image 30. Musculoskeletal: The bones are subjectively under mineralized. Lower lumbar facet hypertrophy. IMPRESSION: 1. Fluid throughout the small bowel and colon with occasional areas of enhancement. Findings may represent enterocolitis in the appropriate clinical setting. 2. Large hiatal hernia, the entire stomach is intrathoracic. Hiatal hernia also contains the pancreatic body and tail. 3. Skin thickening and increased density in the inferior left breast. Recommend correlation with physical exam and mammography. 4. Posterior uterine body mass measuring 7 x 7.6 x 5.6 cm typical of fibroid, although nonspecific. 5. Subcentimeter subcapsular hypodensity in the lateral spleen, partially motion obscured. Etiology is indeterminate. Electronically Signed   By: Andrea Gasman M.D.   On: 05/16/2024 18:09   CT HEAD WO CONTRAST ( ) Result Date: 05/16/2024 CLINICAL DATA:  Bone lesion, cervical spine, incidental; Headache, fever EXAM: CT HEAD WITHOUT CONTRAST CT CERVICAL SPINE WITHOUT CONTRAST TECHNIQUE: Multidetector CT imaging of the head and cervical spine was performed following the standard protocol without intravenous contrast. Multiplanar CT image reconstructions of the cervical spine were also generated. RADIATION DOSE REDUCTION: This exam was performed according to the departmental dose-optimization program which includes automated exposure control, adjustment of the mA and/or kV according to patient size and/or use of iterative reconstruction technique. COMPARISON:  None Available. FINDINGS: CT HEAD FINDINGS Brain: No evidence of large-territorial acute infarction. No parenchymal hemorrhage. No mass lesion. No extra-axial collection. No mass effect or midline shift. No hydrocephalus. Basilar cisterns are patent. Vascular: No hyperdense vessel. Skull: No acute fracture or focal lesion. Sinuses/Orbits: Paranasal sinuses and mastoid air cells are clear. The orbits are unremarkable. Other: None. CT  CERVICAL SPINE FINDINGS Alignment: Normal. Skull base and vertebrae: Multilevel mild degenerative changes of the spine. No acute fracture. No aggressive appearing focal osseous lesion or focal pathologic process. Soft tissues and spinal canal: No prevertebral fluid or swelling. No visible canal hematoma. Upper chest: Unremarkable. Other: None. IMPRESSION: 1. No acute intracranial abnormality. 2. No acute displaced fracture or traumatic listhesis of the cervical spine. Electronically Signed   By: Morgane  Naveau M.D.   On: 05/16/2024 18:02   CT Cervical Spine Wo Contrast Result Date: 05/16/2024 CLINICAL DATA:  Bone lesion, cervical spine, incidental; Headache, fever EXAM: CT HEAD WITHOUT CONTRAST CT CERVICAL SPINE WITHOUT CONTRAST TECHNIQUE: Multidetector CT imaging of the head and cervical spine was performed following the standard protocol without intravenous contrast. Multiplanar CT image reconstructions of the cervical spine were also generated. RADIATION DOSE REDUCTION: This exam was performed according to the departmental dose-optimization program which includes automated exposure control, adjustment of the mA and/or kV according to patient size and/or use of iterative reconstruction technique. COMPARISON:  None Available. FINDINGS: CT HEAD FINDINGS Brain: No evidence of large-territorial acute infarction. No parenchymal hemorrhage. No mass lesion. No extra-axial collection. No mass effect or midline shift. No hydrocephalus. Basilar cisterns are patent. Vascular: No hyperdense vessel. Skull: No acute fracture or focal lesion. Sinuses/Orbits: Paranasal sinuses and mastoid air cells are clear. The orbits are unremarkable. Other: None. CT CERVICAL SPINE FINDINGS Alignment: Normal. Skull base and vertebrae: Multilevel mild degenerative changes of the spine. No acute fracture. No aggressive appearing focal osseous lesion or focal pathologic process. Soft tissues and spinal canal: No prevertebral fluid or swelling.  No visible canal hematoma. Upper chest: Unremarkable. Other: None. IMPRESSION: 1. No acute intracranial abnormality. 2. No acute displaced fracture  or traumatic listhesis of the cervical spine. Electronically Signed   By: Morgane  Naveau M.D.   On: 05/16/2024 18:02   CT Angio Chest PE W and/or Wo Contrast Result Date: 05/16/2024 CLINICAL DATA:  Provided history: Pulmonary embolism (PE) suspected, high prob Weakness.  Decreased p.o. intake. EXAM: CT ANGIOGRAPHY CHEST WITH CONTRAST TECHNIQUE: Multidetector CT imaging of the chest was performed using the standard protocol during bolus administration of intravenous contrast. Multiplanar CT image reconstructions and MIPs were obtained to evaluate the vascular anatomy. RADIATION DOSE REDUCTION: This exam was performed according to the departmental dose-optimization program which includes automated exposure control, adjustment of the mA and/or kV according to patient size and/or use of iterative reconstruction technique. CONTRAST:  80mL OMNIPAQUE  IOHEXOL  350 MG/ML SOLN COMPARISON:  Chest radiograph 01/11/2020, no interval imaging available. FINDINGS: Cardiovascular: There are no filling defects within the pulmonary arteries to suggest pulmonary embolus. The heart is normal in size. No pericardial effusion. Aortic atherosclerosis crash that mild aortic tortuosity, no aneurysm or acute aortic findings. Mediastinum/Nodes: Large hiatal hernia, the entire stomach is intrathoracic. There is an air-fluid level noted in the intrathoracic stomach. No enlarged mediastinal or hilar lymph nodes. There is bilateral low axillary adenopathy, more so on the left. 13 mm left axillary node series 4, image 52. 14 mm left axillary node series 4, image 48. There is a prominent 9 mm left subpectoral node series 4, image 25. Largest lymph node on the right is in the axilla, measures 10 mm, series 4, image 40. there prominent right subpectoral nodes. Lungs/Pleura: Large left hiatal hernia with  adjacent compressive atelectasis. No confluent consolidation. No significant pleural effusion. No features of pulmonary edema. No pulmonary mass or suspicious nodule. Upper Abdomen: Assessed on concurrent abdominopelvic CT, reported separately. Musculoskeletal: Minimally displaced right lateral third rib fracture. Moderate T5 superior endplate compression fracture. Moderate diffuse thoracic spondylosis with anterior spurring. On concurrent abdominopelvic CT there is skin thickening and increased density in the inferior left breast, but no obvious breast mass. Review of the MIP images confirms the above findings. IMPRESSION: 1. No pulmonary embolus. 2. Large hiatal hernia, the entire stomach is intrathoracic. 3. Bilateral axillary and subpectoral adenopathy, more so on the left. This is nonspecific. Differential considerations include reactive nodes, lymphoproliferative disorder such as lymphoma and metastatic disease. Skin thickening and increased density seen in the inferior left breast on concurrent abdominopelvic CT, but no obvious breast mass. Recommend up-to-date mammography. 4. Minimally displaced right lateral third rib fracture. 5. Moderate T5 superior endplate compression fracture, age indeterminate. Aortic Atherosclerosis (ICD10-I70.0). Electronically Signed   By: Andrea Gasman M.D.   On: 05/16/2024 18:01   DG Foot 2 Views Left Result Date: 05/16/2024 CLINICAL DATA:  Osteomyelitis evaluation EXAM: LEFT FOOT - 2 VIEW COMPARISON:  None Available. FINDINGS: No acute fractures are identified. No osseous erosions. Contraction deformity involving the second toe. Soft tissue swelling about the forefoot. 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 IMPRESSION: No definite radiographic evidence for acute osteomyelitis. Contractures involving the toes and hallux valgus deformity. Electronically Signed   By: Michaeline Blanch M.D.   On: 05/16/2024 16:05   DG Foot 2 Views Right Result Date:  05/16/2024 CLINICAL DATA:  Evaluate for osteomyelitis EXAM: RIGHT FOOT - 2 VIEW COMPARISON:  None Available. FINDINGS: No acute fractures are identified. No osseous erosions. Likely plantar dislocation of the second digit middle phalanx at the PIP joint. Marked metatarsus primus varus and hallux valgus deformity at the first MTP joint. Soft tissue swelling  about the forefoot. IMPRESSION: 1. Plantar dislocation of the second digit middle phalanx at the PIP joint. 2.  Metatarsus primus varus and hallux valgus. 3.  No definite radiographic evidence for acute osteomyelitis. Electronically Signed   By: Michaeline Blanch M.D.   On: 05/16/2024 16:03    Assessment and Plan Colandra Ohanian is a 63 y.o. female with medical history significant of essential hypertension,  GERD who has not been on any medications at home but has apparently been gradually declining not eating or drinking for weeks.    Starvation ketoacidosis severe hypo albuminemia electrolyte abnormality with low potassium, low magnesium -- patient received IV fluids.-- Pharmacy to replace electrolytes -- dietitian to see patient -- multivitamin, PO thiamine   Severe iron  deficiency anemia -- patient denies any melena or weight loss -- G.I. consultation with Dr. Maryruth -- came in with hemoglobin of 5.5-- will give 2 unit transfusion -- IV iron  -- PO iron  -- follow-up B12  Large intra-thoracic hiatal hernia/Gerd poor appetite -- start on Protonix  -- encourage full liquid diet  Severe Intertrigo (under the breast/groin) --poor hygiene/unable to care for self --Nystatin  powder --Diflucan  for 7 days +IV rocephin   Bilateral LE ulcers --follow WOC recs --On IV abxs  Abnormal CT chest--?left inferior breast skin thickening and bilateral LN enlargement --d/w Radiology--no breast mass seen on CT chest. Appears Reactive LN enlargement due to severe intertrigo --pt to get mammogram/US  breast as out pt  TOC for d/c planning PT/OT to see  pt     Procedures: Family communication :none Consults : G.I. CODE STATUS: full DVT Prophylaxis :SCD (severe anemia) Level of care: Med-Surg Status is: Inpatient Remains inpatient appropriate because: severe anemia, bilateral lower extremity for ulcers, electrolyte abnormality    TOTAL TIME TAKING CARE OF THIS PATIENT: 45 minutes.  >50% time spent on counselling and coordination of care  Note: This dictation was prepared with Dragon dictation along with smaller phrase technology. Any transcriptional errors that result from this process are unintentional.  Leita Blanch M.D    [facility]   CC: Primary care physician; Patient, No Pcp Per

## 2024-05-17 NOTE — Progress Notes (Signed)
 OT Cancellation Note  Patient Details Name: Trisha Ken MRN: [id number] DOB: 10-22-1960   Cancelled Treatment:    Reason Eval/Treat Not Completed: Other (comment) (Patient with Hgb 5.5 this AM. Per therapy protocol, will hold until medically appropriate. OT will follow up as able.ALLIE Therisa Sheffield, OTD OTR/L  05/17/24, 8:43 AM

## 2024-05-17 NOTE — Consult Note (Signed)
 PHARMACY CONSULT NOTE - ELECTROLYTES  Pharmacy Consult for Electrolyte Monitoring and Replacement   Recent Labs: Height: 5' 2 (157.5 cm) Weight: 74.8 kg (164 lb 14.5 oz) IBW/kg (Calculated) : 50.1 Estimated Creatinine Clearance: 45.8 mL/min (A) (by C-G formula based on SCr of 1.19 mg/dL (H)). Potassium (mmol/L)  Date Value  05/17/2024 2.8 (L)   Magnesium (mg/dL)  Date Value  90/95/7974 2.0   Calcium (mg/dL)  Date Value  90/94/7974 7.5 (L)   Albumin (g/dL)  Date Value  90/94/7974 1.8 (L)   Sodium (mmol/L)  Date Value  05/17/2024 132 (L)   Corrected Ca: 8.9 mg/dL  Assessment  Cathy Grant is a 63 y.o. female presenting with primary problem of starvation ketoacidosis. PMH significant for essential hypertension, suspected breast cancer, and GERD. Patient mentioned having poor appetite at home. CT A/P showed hiatal hernia with anterior stomach. Pharmacy has been consulted to monitor and replace electrolytes.  Diet: regular MIVF: D5NS @ 100 mL/hr Pertinent medications: N/A  Goal of Therapy: Electrolytes WNL  9/5 0530: K 2.8  Plan:  Will give Kcl 10 mEq IV every 1 hour x 2 doses Will give Kcl 40 mEq PO x 1  Will check K with BMP in the AM   Thank you for allowing pharmacy to be a part of this patient's care.   Ransom Blanch PGY-1 Pharmacy Resident  [place] [facility]  05/17/2024 8:52 AM

## 2024-05-17 NOTE — Consult Note (Addendum)
 WOC Nurse Consult Note: Reason for Consult: Consult requested for BLE.  Pt states she had wounds before which healed before with some type of cream, but then they came back when I sad infected dirty water where I was living. Pt is on systemic antibiotics.  Bilat feet and legs with generalized edema and erythremia.  Left anterior foot with several scattered full thickness wounds, moist with yellow slough; affected area is 7X8cm Right anterior foot is yellow and dry with full thickness wound, 3X3cm Right anterior calf with scattered full thickness wounds, yellow moist slough, affected area is approx 5X3cm Dressing procedure/placement/frequency: Topical treatment orders provided for bedside nurses to perform as follows to assist with removal of nonviable tissue: Apply Medihoney to left and right anterior feet and right leg wounds Q day, then cover with foam dressings.  Change foam dressings Q 3 days or PRN soiling.  Pt could benefit from home health assistance after discharge for dressing change assistance since she states it is difficult to reach her feet. Please order if desired.   Please re-consult if further assistance is needed.  Thank-you,  Stephane Fought MSN, RN, CWOCN, CWCN-AP, CNS Contact Mon-Fri 0700-1500: [phone]

## 2024-05-17 NOTE — Consult Note (Signed)
 Consultation  Referring Provider:    Hospitalist  Admit date: 05/16/2024 Consult date        05/17/2024 Reason for Consultation: IDA             HPI:   Cathy Grant is a 63 y.o. with unclear PMH who presented with failure to thrive, electrolyte abnormalities, and IDA. She states she lives in a group home and lost her job in June and all her symptoms started around then. She has lower extremity wounds and trouble with PO intake. She doesn't have a vehicle and was unable to get anybody to do her grocery shopping. She denies NSAID use or any surgeries. She can only tolerate water and chicken noodle soup. She has brother in the area and a sister in [place] . She denies any GI bleeding.   Past Medical History:  Diagnosis Date   Hypertension     History reviewed. No pertinent surgical history.  History reviewed. No pertinent family history.   Social History   Tobacco Use   Smoking status: Never   Smokeless tobacco: Never  Vaping Use   Vaping status: Never Used  Substance Use Topics   Alcohol use: Never   Drug use: Never    Prior to Admission medications   Not on File    Current Facility-Administered Medications  Medication Dose Route Frequency Provider Last Rate Last Admin   acetaminophen  (TYLENOL ) tablet 650 mg  650 mg Oral Q6H PRN Patel, Sona, MD       cefTRIAXone  (ROCEPHIN ) 2 g in sodium chloride  0.9 % 100 mL IVPB  2 g Intravenous Q24H Sim, Mohammad L, MD 200 mL/hr at 05/16/24 2256 2 g at 05/16/24 2256   fluconazole  (DIFLUCAN ) tablet 100 mg  100 mg Oral Daily Patel, Sona, MD   100 mg at 05/17/24 1711   folic acid  (FOLVITE ) tablet 1 mg  1 mg Oral Daily Garba, Mohammad L, MD   1 mg at 05/17/24 1055   iron  polysaccharides (NIFEREX) capsule 150 mg  150 mg Oral Daily Patel, Sona, MD   150 mg at 05/17/24 1323   iron  sucrose (VENOFER ) 200 mg in sodium chloride  0.9 % 100 mL IVPB  200 mg Intravenous Daily Patel, Sona, MD 440 mL/hr at 05/17/24 1321 200 mg at 05/17/24 1321    leptospermum manuka honey (MEDIHONEY) paste 1 Application  1 Application Topical Daily Tobie Calix, MD       morphine  (PF) 2 MG/ML injection 2 mg  2 mg Intravenous Q4H PRN Patel, Sona, MD       multivitamin with minerals tablet 1 tablet  1 tablet Oral Daily Garba, Mohammad L, MD   1 tablet at 05/17/24 1055   nystatin  (MYCOSTATIN /NYSTOP ) topical powder   Topical TID Patel, Sona, MD   Given at 05/17/24 1713   ondansetron  (ZOFRAN ) tablet 4 mg  4 mg Oral Q6H PRN Sim Emery CROME, MD       Or   ondansetron  (ZOFRAN ) injection 4 mg  4 mg Intravenous Q6H PRN Sim Emery CROME, MD       pantoprazole  (PROTONIX ) EC tablet 40 mg  40 mg Oral Daily Patel, Sona, MD   40 mg at 05/17/24 1317   senna-docusate (Senokot-S) tablet 2 tablet  2 tablet Oral QHS Patel, Sona, MD       thiamine  (VITAMIN B1) tablet 100 mg  100 mg Oral Daily Patel, Sona, MD   100 mg at 05/17/24 1317   traMADol  (ULTRAM ) tablet 50 mg  50 mg Oral Q6H  PRN Patel, Sona, MD        Allergies as of 05/16/2024   (No Known Allergies)     Review of Systems:    All systems reviewed and negative except where noted in HPI.  Review of Systems  Constitutional:  Positive for malaise/fatigue. Negative for chills and fever.  Respiratory:  Negative for shortness of breath.   Cardiovascular:  Negative for chest pain.  Gastrointestinal:  Positive for abdominal pain and nausea. Negative for blood in stool and melena.  Skin:  Positive for rash.  Psychiatric/Behavioral:  Negative for substance abuse.   All other systems reviewed and are negative.      Physical Exam:  Vital signs in last 24 hours: Temp:  [97.5 F (36.4 C)-98.5 F (36.9 C)] (P) 98.5 F (36.9 C) (09/05 2000) Pulse Rate:  [83-93] (P) 93 (09/05 2000) Resp:  [15-16] (P) 16 (09/05 2000) BP: (82-132)/(48-62) (P) 127/62 (09/05 2000) SpO2:  [97 %-100 %] (P) 97 % (09/05 2000) Last BM Date : 05/16/24 General:   NAD Head:  Normocephalic and atraumatic. Eyes:   No icterus.   Conjunctiva  pink. Ears:  Normal auditory acuity. Mouth: Mucosa pink moist, no lesions. Neck:  Supple; no masses felt Lungs:  No respiratory distress Abdomen:   Flat, soft, nondistended, nontender Msk:  No clubbing or cyanosis Neurologic:  No focal deficits Skin:  Warm, dry, pink without significant lesions or rashes. Psych:  Alert and cooperative. Flat affect  LAB RESULTS: Recent Labs    05/16/24 1340 05/17/24 0530  WBC 17.2* 8.5  HGB 7.5* 5.5*  HCT 27.1* 18.7*  PLT 631* 523*   BMET Recent Labs    05/16/24 1340 05/17/24 0530  NA 132* 132*  K 4.5 2.8*  CL 95* 101  CO2 14* 22  GLUCOSE 91 110*  BUN 32* 24*  CREATININE 1.45* 1.19*  CALCIUM 8.8* 7.5*   LFT Recent Labs    05/17/24 0530  PROT 5.3*  ALBUMIN 1.8*  AST 20  ALT 12  ALKPHOS 54  BILITOT 0.6   PT/INR Recent Labs    05/16/24 1657  LABPROT 15.0  INR 1.1    STUDIES: CT ABDOMEN PELVIS W CONTRAST Result Date: 05/16/2024 CLINICAL DATA:  Provided history: Abdominal pain, acute, nonlocalized Eighty weakness.  Decreased p.o. intake. EXAM: CT ABDOMEN AND PELVIS WITH CONTRAST TECHNIQUE: Multidetector CT imaging of the abdomen and pelvis was performed using the standard protocol following bolus administration of intravenous contrast. RADIATION DOSE REDUCTION: This exam was performed according to the departmental dose-optimization program which includes automated exposure control, adjustment of the mA and/or kV according to patient size and/or use of iterative reconstruction technique. CONTRAST:  80mL OMNIPAQUE  IOHEXOL  350 MG/ML SOLN COMPARISON:  None Available. FINDINGS: Lower chest: Assessed on concurrent chest CT, reported separately. Hepatobiliary: Motion artifact limitations. Question hepatic steatosis. No evidence of hepatic lesion. Partially distended gallbladder, motion obscured. No calcified gallstone. No biliary dilatation. Pancreas: No ductal dilatation or inflammation. The distal body and tail of the pancreas extend into  large hiatal hernia. No obvious pancreatic mass, motion obscures the pancreatic head. Spleen: No splenomegaly. 8 mm subcapsular hypodensity in the lateral spleen series 2, image 24, partially motion obscured. Adrenals/Urinary Tract: No adrenal nodule. No hydronephrosis or renal calculi. Bilateral parapelvic cysts. No evidence of renal inflammation or suspicious renal lesion. Unremarkable urinary bladder. Stomach/Bowel: Bowel assessment is limited in the absence of enteric contrast, as well as motion artifact in the upper abdomen. Large hiatal hernia, the entire stomach is  intrathoracic. Fluid within nondilated small bowel. There is fluid throughout the colon. Occasional areas of small bowel and colonic enhancement suspected, but no convincing wall thickening. Normal appendix. Vascular/Lymphatic: Normal caliber abdominal aorta. Portal vein is patent. No enlarged lymph nodes in the abdomen or pelvis. All lymph nodes are subcentimeter short axis and not enlarged by size criteria. Reproductive: Posterior uterine body mass measuring 7 x 7.6 x 5.6 cm typical of fibroid, although nonspecific. Atrophic ovaries, normal for age. Other: No ascites. No free air or omental thickening. No abdominal wall or inguinal hernia. There is skin thickening and increased density in the inferior left breast, series 2, image 30. Musculoskeletal: The bones are subjectively under mineralized. Lower lumbar facet hypertrophy. IMPRESSION: 1. Fluid throughout the small bowel and colon with occasional areas of enhancement. Findings may represent enterocolitis in the appropriate clinical setting. 2. Large hiatal hernia, the entire stomach is intrathoracic. Hiatal hernia also contains the pancreatic body and tail. 3. Skin thickening and increased density in the inferior left breast. Recommend correlation with physical exam and mammography. 4. Posterior uterine body mass measuring 7 x 7.6 x 5.6 cm typical of fibroid, although nonspecific. 5.  Subcentimeter subcapsular hypodensity in the lateral spleen, partially motion obscured. Etiology is indeterminate. Electronically Signed   By: Andrea Gasman M.D.   On: 05/16/2024 18:09   CT HEAD WO CONTRAST ( ) Result Date: 05/16/2024 CLINICAL DATA:  Bone lesion, cervical spine, incidental; Headache, fever EXAM: CT HEAD WITHOUT CONTRAST CT CERVICAL SPINE WITHOUT CONTRAST TECHNIQUE: Multidetector CT imaging of the head and cervical spine was performed following the standard protocol without intravenous contrast. Multiplanar CT image reconstructions of the cervical spine were also generated. RADIATION DOSE REDUCTION: This exam was performed according to the departmental dose-optimization program which includes automated exposure control, adjustment of the mA and/or kV according to patient size and/or use of iterative reconstruction technique. COMPARISON:  None Available. FINDINGS: CT HEAD FINDINGS Brain: No evidence of large-territorial acute infarction. No parenchymal hemorrhage. No mass lesion. No extra-axial collection. No mass effect or midline shift. No hydrocephalus. Basilar cisterns are patent. Vascular: No hyperdense vessel. Skull: No acute fracture or focal lesion. Sinuses/Orbits: Paranasal sinuses and mastoid air cells are clear. The orbits are unremarkable. Other: None. CT CERVICAL SPINE FINDINGS Alignment: Normal. Skull base and vertebrae: Multilevel mild degenerative changes of the spine. No acute fracture. No aggressive appearing focal osseous lesion or focal pathologic process. Soft tissues and spinal canal: No prevertebral fluid or swelling. No visible canal hematoma. Upper chest: Unremarkable. Other: None. IMPRESSION: 1. No acute intracranial abnormality. 2. No acute displaced fracture or traumatic listhesis of the cervical spine. Electronically Signed   By: Morgane  Naveau M.D.   On: 05/16/2024 18:02   CT Cervical Spine Wo Contrast Result Date: 05/16/2024 CLINICAL DATA:  Bone lesion,  cervical spine, incidental; Headache, fever EXAM: CT HEAD WITHOUT CONTRAST CT CERVICAL SPINE WITHOUT CONTRAST TECHNIQUE: Multidetector CT imaging of the head and cervical spine was performed following the standard protocol without intravenous contrast. Multiplanar CT image reconstructions of the cervical spine were also generated. RADIATION DOSE REDUCTION: This exam was performed according to the departmental dose-optimization program which includes automated exposure control, adjustment of the mA and/or kV according to patient size and/or use of iterative reconstruction technique. COMPARISON:  None Available. FINDINGS: CT HEAD FINDINGS Brain: No evidence of large-territorial acute infarction. No parenchymal hemorrhage. No mass lesion. No extra-axial collection. No mass effect or midline shift. No hydrocephalus. Basilar cisterns are patent. Vascular: No hyperdense vessel.  Skull: No acute fracture or focal lesion. Sinuses/Orbits: Paranasal sinuses and mastoid air cells are clear. The orbits are unremarkable. Other: None. CT CERVICAL SPINE FINDINGS Alignment: Normal. Skull base and vertebrae: Multilevel mild degenerative changes of the spine. No acute fracture. No aggressive appearing focal osseous lesion or focal pathologic process. Soft tissues and spinal canal: No prevertebral fluid or swelling. No visible canal hematoma. Upper chest: Unremarkable. Other: None. IMPRESSION: 1. No acute intracranial abnormality. 2. No acute displaced fracture or traumatic listhesis of the cervical spine. Electronically Signed   By: Morgane  Naveau M.D.   On: 05/16/2024 18:02   CT Angio Chest PE W and/or Wo Contrast Result Date: 05/16/2024 CLINICAL DATA:  Provided history: Pulmonary embolism (PE) suspected, high prob Weakness.  Decreased p.o. intake. EXAM: CT ANGIOGRAPHY CHEST WITH CONTRAST TECHNIQUE: Multidetector CT imaging of the chest was performed using the standard protocol during bolus administration of intravenous contrast.  Multiplanar CT image reconstructions and MIPs were obtained to evaluate the vascular anatomy. RADIATION DOSE REDUCTION: This exam was performed according to the departmental dose-optimization program which includes automated exposure control, adjustment of the mA and/or kV according to patient size and/or use of iterative reconstruction technique. CONTRAST:  80mL OMNIPAQUE  IOHEXOL  350 MG/ML SOLN COMPARISON:  Chest radiograph 01/11/2020, no interval imaging available. FINDINGS: Cardiovascular: There are no filling defects within the pulmonary arteries to suggest pulmonary embolus. The heart is normal in size. No pericardial effusion. Aortic atherosclerosis crash that mild aortic tortuosity, no aneurysm or acute aortic findings. Mediastinum/Nodes: Large hiatal hernia, the entire stomach is intrathoracic. There is an air-fluid level noted in the intrathoracic stomach. No enlarged mediastinal or hilar lymph nodes. There is bilateral low axillary adenopathy, more so on the left. 13 mm left axillary node series 4, image 52. 14 mm left axillary node series 4, image 48. There is a prominent 9 mm left subpectoral node series 4, image 25. Largest lymph node on the right is in the axilla, measures 10 mm, series 4, image 40. there prominent right subpectoral nodes. Lungs/Pleura: Large left hiatal hernia with adjacent compressive atelectasis. No confluent consolidation. No significant pleural effusion. No features of pulmonary edema. No pulmonary mass or suspicious nodule. Upper Abdomen: Assessed on concurrent abdominopelvic CT, reported separately. Musculoskeletal: Minimally displaced right lateral third rib fracture. Moderate T5 superior endplate compression fracture. Moderate diffuse thoracic spondylosis with anterior spurring. On concurrent abdominopelvic CT there is skin thickening and increased density in the inferior left breast, but no obvious breast mass. Review of the MIP images confirms the above findings. IMPRESSION:  1. No pulmonary embolus. 2. Large hiatal hernia, the entire stomach is intrathoracic. 3. Bilateral axillary and subpectoral adenopathy, more so on the left. This is nonspecific. Differential considerations include reactive nodes, lymphoproliferative disorder such as lymphoma and metastatic disease. Skin thickening and increased density seen in the inferior left breast on concurrent abdominopelvic CT, but no obvious breast mass. Recommend up-to-date mammography. 4. Minimally displaced right lateral third rib fracture. 5. Moderate T5 superior endplate compression fracture, age indeterminate. Aortic Atherosclerosis (ICD10-I70.0). Electronically Signed   By: Andrea Gasman M.D.   On: 05/16/2024 18:01   DG Foot 2 Views Left Result Date: 05/16/2024 CLINICAL DATA:  Osteomyelitis evaluation EXAM: LEFT FOOT - 2 VIEW COMPARISON:  None Available. FINDINGS: No acute fractures are identified. No osseous erosions. Contraction deformity involving the second toe. Soft tissue swelling about the forefoot. 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 IMPRESSION: No definite radiographic evidence for acute osteomyelitis. Contractures involving the toes  and hallux valgus deformity. Electronically Signed   By: Michaeline Blanch M.D.   On: 05/16/2024 16:05   DG Foot 2 Views Right Result Date: 05/16/2024 CLINICAL DATA:  Evaluate for osteomyelitis EXAM: RIGHT FOOT - 2 VIEW COMPARISON:  None Available. FINDINGS: No acute fractures are identified. No osseous erosions. Likely plantar dislocation of the second digit middle phalanx at the PIP joint. Marked metatarsus primus varus and hallux valgus deformity at the first MTP joint. Soft tissue swelling about the forefoot. IMPRESSION: 1. Plantar dislocation of the second digit middle phalanx at the PIP joint. 2.  Metatarsus primus varus and hallux valgus. 3.  No definite radiographic evidence for acute osteomyelitis. Electronically Signed   By: Michaeline Blanch M.D.   On: 05/16/2024 16:03        Impression / Plan:   63 y.o. with unclear PMH who presented with failure to thrive, electrolyte abnormalities, and IDA. She does not think she would be able to tolerate the prep from a colonoscopy. Will start with EGD first. I suspect there's is more to her history so attempted to contact her niece but was not able to reach her. Will try again tomorrow.  - correct electrolytes for EGD tomorrow - transfuse to keep hemoglobin > 7 - EGD tomorrow - NPO at midnight - further recs after procedure tomorrow  Please call with any questions or concerns.  Frederic Schick MD, MPH [facility]

## 2024-05-17 NOTE — Plan of Care (Signed)
  Problem: Education: Goal: Knowledge of General Education information will improve Description: Including pain rating scale, medication(s)/side effects and non-pharmacologic comfort measures Outcome: Progressing   Problem: Health Behavior/Discharge Planning: Goal: Ability to manage health-related needs will improve Outcome: Progressing   Problem: Clinical Measurements: Goal: Ability to maintain clinical measurements within normal limits will improve Outcome: Progressing Goal: Will remain free from infection Outcome: Progressing Goal: Diagnostic test results will improve Outcome: Progressing Goal: Cardiovascular complication will be avoided Outcome: Progressing   Problem: Nutrition: Goal: Adequate nutrition will be maintained Outcome: Progressing   Problem: Safety: Goal: Ability to remain free from injury will improve Outcome: Progressing   Problem: Skin Integrity: Goal: Risk for impaired skin integrity will decrease Outcome: Progressing

## 2024-05-17 NOTE — Plan of Care (Signed)

## 2024-05-17 NOTE — Progress Notes (Signed)
 PT Cancellation Note  Patient Details Name: Cathy Grant MRN: [id number] DOB: 11/30/1960   Cancelled Treatment:    Reason Eval/Treat Not Completed: Medical issues which prohibited therapy Orders received, chart reviewed. Patient with Hgb 5.5 this AM. Per therapy protocol, will hold until medically appropriate.   [name], PT, DPT Physical Therapist - [facility]  [facility]   [name] [name] 05/17/2024, 8:41 AM

## 2024-05-18 ENCOUNTER — Inpatient Hospital Stay: Admitting: Anesthesiology

## 2024-05-18 ENCOUNTER — Encounter
Admission: EM | Disposition: A | Discharge status: Skilled Nursing Facility | Payer: Self-pay | Source: Home / Self Care | Attending: Internal Medicine

## 2024-05-18 DIAGNOSIS — T730XXA Starvation, initial encounter: Secondary | ICD-10-CM | POA: Diagnosis not present

## 2024-05-18 DIAGNOSIS — E8729 Other acidosis: Secondary | ICD-10-CM | POA: Diagnosis not present

## 2024-05-18 HISTORY — PX: ESOPHAGOGASTRODUODENOSCOPY: SHX5428

## 2024-05-18 LAB — HEMOGLOBIN AND HEMATOCRIT, BLOOD
HCT: 31.5 % — ABNORMAL LOW (ref 36.0–46.0)
HCT: 32.4 % — ABNORMAL LOW (ref 36.0–46.0)
Hemoglobin: 10 g/dL — ABNORMAL LOW (ref 12.0–15.0)
Hemoglobin: 10.2 g/dL — ABNORMAL LOW (ref 12.0–15.0)

## 2024-05-18 LAB — BPAM RBC
Blood Product Expiration Date: 202510082359
Blood Product Expiration Date: 202510082359
ISSUE DATE / TIME: 202509051531
ISSUE DATE / TIME: 202509052006
Unit Type and Rh: 5100
Unit Type and Rh: 5100

## 2024-05-18 LAB — TYPE AND SCREEN
ABO/RH(D): O POS
Antibody Screen: NEGATIVE
Unit division: 0
Unit division: 0

## 2024-05-18 LAB — BASIC METABOLIC PANEL WITH GFR
Anion gap: 7 (ref 5–15)
BUN: 15 mg/dL (ref 8–23)
CO2: 21 mmol/L — ABNORMAL LOW (ref 22–32)
Calcium: 7.6 mg/dL — ABNORMAL LOW (ref 8.9–10.3)
Chloride: 105 mmol/L (ref 98–111)
Creatinine, Ser: 1.01 mg/dL — ABNORMAL HIGH (ref 0.44–1.00)
GFR, Estimated: >60 mL/min (ref >60)
Glucose, Bld: 88 mg/dL (ref 70–99)
Potassium: 3.2 mmol/L — ABNORMAL LOW (ref 3.5–5.1)
Sodium: 133 mmol/L — ABNORMAL LOW (ref 135–145)

## 2024-05-18 LAB — VITAMIN B12: Vitamin B-12: 342 pg/mL (ref 180–914)

## 2024-05-18 LAB — VITAMIN D 25 HYDROXY (VIT D DEFICIENCY, FRACTURES): Vit D, 25-Hydroxy: 19.09 ng/mL — ABNORMAL LOW (ref 30–100)

## 2024-05-18 SURGERY — EGD (ESOPHAGOGASTRODUODENOSCOPY)
Anesthesia: General

## 2024-05-18 MED ORDER — MIRTAZAPINE 15 MG PO TABS
15.0000 mg | ORAL_TABLET | Freq: Every day | ORAL | Status: DC
  Administered 2024-05-18 – 2024-05-22 (×5): 15 mg via ORAL
  Filled 2024-05-18 (×5): qty 1

## 2024-05-18 MED ORDER — VITAMIN B-12 1000 MCG PO TABS
1000.0000 ug | ORAL_TABLET | Freq: Every day | ORAL | Status: DC
  Administered 2024-05-19 – 2024-05-23 (×5): 1000 ug via ORAL
  Filled 2024-05-18 (×5): qty 1

## 2024-05-18 MED ORDER — MIDODRINE HCL 5 MG PO TABS: 5.0000 mg | ORAL_TABLET | Freq: Three times a day (TID) | ORAL | Status: DC

## 2024-05-18 MED ORDER — PHENYLEPHRINE HCL (PRESSORS) 10 MG/ML IV SOLN
INTRAVENOUS | Status: DC | PRN
Start: 2024-05-18 — End: 2024-05-18
  Administered 2024-05-18: 80 ug via INTRAVENOUS

## 2024-05-18 MED ORDER — POTASSIUM CHLORIDE 10 MEQ/100ML IV SOLN
INTRAVENOUS | Status: AC
  Administered 2024-05-18: 10 meq via INTRAVENOUS
  Filled 2024-05-18: qty 100

## 2024-05-18 MED ORDER — VITAMIN D 25 MCG (1000 UNIT) PO TABS
1000.0000 [IU] | ORAL_TABLET | Freq: Every day | ORAL | Status: DC
  Administered 2024-05-19 – 2024-05-23 (×5): 1000 [IU] via ORAL
  Filled 2024-05-18 (×5): qty 1

## 2024-05-18 MED ORDER — POTASSIUM CHLORIDE 10 MEQ/100ML IV SOLN
10.0000 meq | INTRAVENOUS | Status: DC
  Administered 2024-05-18: 10 meq via INTRAVENOUS
  Filled 2024-05-18: qty 100

## 2024-05-18 MED ORDER — POTASSIUM CHLORIDE CRYS ER 20 MEQ PO TBCR: 40.0000 meq | EXTENDED_RELEASE_TABLET | Freq: Once | ORAL | Status: DC

## 2024-05-18 MED ORDER — PROPOFOL 10 MG/ML IV BOLUS
INTRAVENOUS | Status: DC | PRN
  Administered 2024-05-18: 10 mg via INTRAVENOUS
  Administered 2024-05-18: 50 mg via INTRAVENOUS
  Administered 2024-05-18 (×2): 20 mg via INTRAVENOUS

## 2024-05-18 MED ORDER — EPHEDRINE SULFATE (PRESSORS) 50 MG/ML IJ SOLN
INTRAMUSCULAR | Status: DC | PRN
  Administered 2024-05-18 (×3): 5 mg via INTRAVENOUS

## 2024-05-18 MED ORDER — LIDOCAINE HCL (CARDIAC) PF 100 MG/5ML IV SOSY
PREFILLED_SYRINGE | INTRAVENOUS | Status: DC | PRN
  Administered 2024-05-18: 60 mg via INTRAVENOUS

## 2024-05-18 NOTE — Transfer of Care (Signed)
 Immediate Anesthesia Transfer of Care Note  Patient: Quanisha Drewry  Procedure(s) Performed: EGD (ESOPHAGOGASTRODUODENOSCOPY)  Patient Location: PACU  Anesthesia Type:General  Level of Consciousness: awake, alert , and oriented  Airway & Oxygen Therapy: Patient Spontanous Breathing  Post-op Assessment: Report given to RN and Post -op Vital signs reviewed and stable  Post vital signs: Reviewed and stable  Last Vitals:  Vitals Value Taken Time  BP 87/58   Temp    Pulse 82 05/18/24 11:14  Resp 18 05/18/24 11:14  SpO2 94 % 05/18/24 11:14  Vitals shown include unfiled device data.  Last Pain:  Vitals:   05/18/24 0756  TempSrc: Oral  PainSc:          Complications: No notable events documented.

## 2024-05-18 NOTE — Consult Note (Signed)
 PHARMACY CONSULT NOTE - ELECTROLYTES  Pharmacy Consult for Electrolyte Monitoring and Replacement   Recent Labs: Height: 5' 2 (157.5 cm) Weight: 74.8 kg (164 lb 14.5 oz) IBW/kg (Calculated) : 50.1 Estimated Creatinine Clearance: 54 mL/min (A) (by C-G formula based on SCr of 1.01 mg/dL (H)). Potassium (mmol/L)  Date Value  05/18/2024 3.2 (L)   Magnesium  (mg/dL)  Date Value  90/95/7974 2.0   Calcium (mg/dL)  Date Value  90/93/7974 7.6 (L)   Albumin (g/dL)  Date Value  90/94/7974 1.8 (L)   Sodium (mmol/L)  Date Value  05/18/2024 133 (L)   Corrected Ca: 9.4 mg/dL   ( ca 7.6  Alb 1.8)  Assessment  Cathy Grant is a 63 y.o. female presenting with primary problem of starvation ketoacidosis. PMH significant for essential hypertension, suspected breast cancer, and GERD. Patient mentioned having poor appetite at home. CT A/P showed hiatal hernia with anterior stomach. Pharmacy has been consulted to monitor and replace electrolytes.  Diet:NPO for EGD MIVF:none Pertinent medications: N/A  Goal of Therapy: Electrolytes WNL   Plan:  K 3.2  Will order Kcl 10 mEq IV every 1 hour x 4 doses  (Pt NPO for procedure) Will check electrolytes in the AM   Thank you for allowing pharmacy to be a part of this patient's care.   Allean Haas PharmD Clinical Pharmacist 05/18/2024

## 2024-05-18 NOTE — Anesthesia Postprocedure Evaluation (Signed)
 Anesthesia Post Note  Patient: Cathy Grant  Procedure(s) Performed: EGD (ESOPHAGOGASTRODUODENOSCOPY)  Patient location during evaluation: PACU Anesthesia Type: General Level of consciousness: awake and alert Pain management: pain level controlled Vital Signs Assessment: post-procedure vital signs reviewed and stable Respiratory status: spontaneous breathing, nonlabored ventilation, respiratory function stable and patient connected to nasal cannula oxygen Cardiovascular status: blood pressure returned to baseline and stable Postop Assessment: no apparent nausea or vomiting Anesthetic complications: no   No notable events documented.   Last Vitals:  Vitals:   05/18/24 1345 05/18/24 1400  BP: 110/67 124/66  Pulse: 81 83  Resp: 19 17  Temp:    SpO2: 99% 99%    Last Pain:  Vitals:   05/18/24 1132  TempSrc:   PainSc: 0-No pain                 Cathy Grant

## 2024-05-18 NOTE — Progress Notes (Signed)
 [facility]  - [place] at [facility]   PATIENT NAME: Cathy Grant    MR#:  [id number]  DATE OF BIRTH:  April 05, 1961  SUBJECTIVE:  no family at bedside patient seen in PACU. She is post EGD. Overall looks better  VITALS:  Blood pressure 124/66, pulse 83, temperature 98.1 F (36.7 C), resp. rate 17, height 5' 2 (1.575 m), weight 74.8 kg, SpO2 99%.  PHYSICAL EXAMINATION:   GENERAL:  63 y.o.-year-old patient with no acute distress. obese LUNGS: Normal breath sounds bilaterally, no wheezing CARDIOVASCULAR: S1, S2 normal. No murmur   ABDOMEN: Soft, nontender, nondistended. Bowel sounds present.  Significant intertrigo EXTREMITIES: On admission    NEUROLOGIC: nonfocal  patient is alert and awake SKIN: as above  LABORATORY PANEL:  CBC Recent Labs  Lab 05/17/24 0530 05/18/24 0334 05/18/24 1227  WBC 8.5  --   --   HGB 5.5*   < > 10.2*  HCT 18.7*   < > 32.4*  PLT 523*  --   --    < > = values in this interval not displayed.    Chemistries  Recent Labs  Lab 05/16/24 1340 05/17/24 0530 05/18/24 0334  NA 132* 132* 133*  K 4.5 2.8* 3.2*  CL 95* 101 105  CO2 14* 22 21*  GLUCOSE 91 110* 88  BUN 32* 24* 15  CREATININE 1.45* 1.19* 1.01*  CALCIUM 8.8* 7.5* 7.6*  MG 2.0  --   --   AST 33 20  --   ALT 17 12  --   ALKPHOS 71 54  --   BILITOT 1.6* 0.6  --    Cardiac Enzymes No results for input(s): TROPONINI in the last 168 hours. RADIOLOGY:  CT ABDOMEN PELVIS W CONTRAST Result Date: 05/16/2024 CLINICAL DATA:  Provided history: Abdominal pain, acute, nonlocalized Eighty weakness.  Decreased p.o. intake. EXAM: CT ABDOMEN AND PELVIS WITH CONTRAST TECHNIQUE: Multidetector CT imaging of the abdomen and pelvis was performed using the standard protocol following bolus administration of intravenous contrast. RADIATION DOSE REDUCTION: This exam was performed according to the departmental dose-optimization program which includes automated exposure control,  adjustment of the mA and/or kV according to patient size and/or use of iterative reconstruction technique. CONTRAST:  80mL OMNIPAQUE  IOHEXOL  350 MG/ML SOLN COMPARISON:  None Available. FINDINGS: Lower chest: Assessed on concurrent chest CT, reported separately. Hepatobiliary: Motion artifact limitations. Question hepatic steatosis. No evidence of hepatic lesion. Partially distended gallbladder, motion obscured. No calcified gallstone. No biliary dilatation. Pancreas: No ductal dilatation or inflammation. The distal body and tail of the pancreas extend into large hiatal hernia. No obvious pancreatic mass, motion obscures the pancreatic head. Spleen: No splenomegaly. 8 mm subcapsular hypodensity in the lateral spleen series 2, image 24, partially motion obscured. Adrenals/Urinary Tract: No adrenal nodule. No hydronephrosis or renal calculi. Bilateral parapelvic cysts. No evidence of renal inflammation or suspicious renal lesion. Unremarkable urinary bladder. Stomach/Bowel: Bowel assessment is limited in the absence of enteric contrast, as well as motion artifact in the upper abdomen. Large hiatal hernia, the entire stomach is intrathoracic. Fluid within nondilated small bowel. There is fluid throughout the colon. Occasional areas of small bowel and colonic enhancement suspected, but no convincing wall thickening. Normal appendix. Vascular/Lymphatic: Normal caliber abdominal aorta. Portal vein is patent. No enlarged lymph nodes in the abdomen or pelvis. All lymph nodes are subcentimeter short axis and not enlarged by size criteria. Reproductive: Posterior uterine body mass measuring 7 x 7.6 x 5.6 cm typical of fibroid, although  nonspecific. Atrophic ovaries, normal for age. Other: No ascites. No free air or omental thickening. No abdominal wall or inguinal hernia. There is skin thickening and increased density in the inferior left breast, series 2, image 30. Musculoskeletal: The bones are subjectively under  mineralized. Lower lumbar facet hypertrophy. IMPRESSION: 1. Fluid throughout the small bowel and colon with occasional areas of enhancement. Findings may represent enterocolitis in the appropriate clinical setting. 2. Large hiatal hernia, the entire stomach is intrathoracic. Hiatal hernia also contains the pancreatic body and tail. 3. Skin thickening and increased density in the inferior left breast. Recommend correlation with physical exam and mammography. 4. Posterior uterine body mass measuring 7 x 7.6 x 5.6 cm typical of fibroid, although nonspecific. 5. Subcentimeter subcapsular hypodensity in the lateral spleen, partially motion obscured. Etiology is indeterminate. Electronically Signed   By: Andrea Gasman M.D.   On: 05/16/2024 18:09   CT HEAD WO CONTRAST ( ) Result Date: 05/16/2024 CLINICAL DATA:  Bone lesion, cervical spine, incidental; Headache, fever EXAM: CT HEAD WITHOUT CONTRAST CT CERVICAL SPINE WITHOUT CONTRAST TECHNIQUE: Multidetector CT imaging of the head and cervical spine was performed following the standard protocol without intravenous contrast. Multiplanar CT image reconstructions of the cervical spine were also generated. RADIATION DOSE REDUCTION: This exam was performed according to the departmental dose-optimization program which includes automated exposure control, adjustment of the mA and/or kV according to patient size and/or use of iterative reconstruction technique. COMPARISON:  None Available. FINDINGS: CT HEAD FINDINGS Brain: No evidence of large-territorial acute infarction. No parenchymal hemorrhage. No mass lesion. No extra-axial collection. No mass effect or midline shift. No hydrocephalus. Basilar cisterns are patent. Vascular: No hyperdense vessel. Skull: No acute fracture or focal lesion. Sinuses/Orbits: Paranasal sinuses and mastoid air cells are clear. The orbits are unremarkable. Other: None. CT CERVICAL SPINE FINDINGS Alignment: Normal. Skull base and vertebrae:  Multilevel mild degenerative changes of the spine. No acute fracture. No aggressive appearing focal osseous lesion or focal pathologic process. Soft tissues and spinal canal: No prevertebral fluid or swelling. No visible canal hematoma. Upper chest: Unremarkable. Other: None. IMPRESSION: 1. No acute intracranial abnormality. 2. No acute displaced fracture or traumatic listhesis of the cervical spine. Electronically Signed   By: Morgane  Naveau M.D.   On: 05/16/2024 18:02   CT Cervical Spine Wo Contrast Result Date: 05/16/2024 CLINICAL DATA:  Bone lesion, cervical spine, incidental; Headache, fever EXAM: CT HEAD WITHOUT CONTRAST CT CERVICAL SPINE WITHOUT CONTRAST TECHNIQUE: Multidetector CT imaging of the head and cervical spine was performed following the standard protocol without intravenous contrast. Multiplanar CT image reconstructions of the cervical spine were also generated. RADIATION DOSE REDUCTION: This exam was performed according to the departmental dose-optimization program which includes automated exposure control, adjustment of the mA and/or kV according to patient size and/or use of iterative reconstruction technique. COMPARISON:  None Available. FINDINGS: CT HEAD FINDINGS Brain: No evidence of large-territorial acute infarction. No parenchymal hemorrhage. No mass lesion. No extra-axial collection. No mass effect or midline shift. No hydrocephalus. Basilar cisterns are patent. Vascular: No hyperdense vessel. Skull: No acute fracture or focal lesion. Sinuses/Orbits: Paranasal sinuses and mastoid air cells are clear. The orbits are unremarkable. Other: None. CT CERVICAL SPINE FINDINGS Alignment: Normal. Skull base and vertebrae: Multilevel mild degenerative changes of the spine. No acute fracture. No aggressive appearing focal osseous lesion or focal pathologic process. Soft tissues and spinal canal: No prevertebral fluid or swelling. No visible canal hematoma. Upper chest: Unremarkable. Other: None.  IMPRESSION: 1. No  acute intracranial abnormality. 2. No acute displaced fracture or traumatic listhesis of the cervical spine. Electronically Signed   By: Morgane  Naveau M.D.   On: 05/16/2024 18:02   CT Angio Chest PE W and/or Wo Contrast Result Date: 05/16/2024 CLINICAL DATA:  Provided history: Pulmonary embolism (PE) suspected, high prob Weakness.  Decreased p.o. intake. EXAM: CT ANGIOGRAPHY CHEST WITH CONTRAST TECHNIQUE: Multidetector CT imaging of the chest was performed using the standard protocol during bolus administration of intravenous contrast. Multiplanar CT image reconstructions and MIPs were obtained to evaluate the vascular anatomy. RADIATION DOSE REDUCTION: This exam was performed according to the departmental dose-optimization program which includes automated exposure control, adjustment of the mA and/or kV according to patient size and/or use of iterative reconstruction technique. CONTRAST:  80mL OMNIPAQUE  IOHEXOL  350 MG/ML SOLN COMPARISON:  Chest radiograph 01/11/2020, no interval imaging available. FINDINGS: Cardiovascular: There are no filling defects within the pulmonary arteries to suggest pulmonary embolus. The heart is normal in size. No pericardial effusion. Aortic atherosclerosis crash that mild aortic tortuosity, no aneurysm or acute aortic findings. Mediastinum/Nodes: Large hiatal hernia, the entire stomach is intrathoracic. There is an air-fluid level noted in the intrathoracic stomach. No enlarged mediastinal or hilar lymph nodes. There is bilateral low axillary adenopathy, more so on the left. 13 mm left axillary node series 4, image 52. 14 mm left axillary node series 4, image 48. There is a prominent 9 mm left subpectoral node series 4, image 25. Largest lymph node on the right is in the axilla, measures 10 mm, series 4, image 40. there prominent right subpectoral nodes. Lungs/Pleura: Large left hiatal hernia with adjacent compressive atelectasis. No confluent consolidation. No  significant pleural effusion. No features of pulmonary edema. No pulmonary mass or suspicious nodule. Upper Abdomen: Assessed on concurrent abdominopelvic CT, reported separately. Musculoskeletal: Minimally displaced right lateral third rib fracture. Moderate T5 superior endplate compression fracture. Moderate diffuse thoracic spondylosis with anterior spurring. On concurrent abdominopelvic CT there is skin thickening and increased density in the inferior left breast, but no obvious breast mass. Review of the MIP images confirms the above findings. IMPRESSION: 1. No pulmonary embolus. 2. Large hiatal hernia, the entire stomach is intrathoracic. 3. Bilateral axillary and subpectoral adenopathy, more so on the left. This is nonspecific. Differential considerations include reactive nodes, lymphoproliferative disorder such as lymphoma and metastatic disease. Skin thickening and increased density seen in the inferior left breast on concurrent abdominopelvic CT, but no obvious breast mass. Recommend up-to-date mammography. 4. Minimally displaced right lateral third rib fracture. 5. Moderate T5 superior endplate compression fracture, age indeterminate. Aortic Atherosclerosis (ICD10-I70.0). Electronically Signed   By: Andrea Gasman M.D.   On: 05/16/2024 18:01   DG Foot 2 Views Left Result Date: 05/16/2024 CLINICAL DATA:  Osteomyelitis evaluation EXAM: LEFT FOOT - 2 VIEW COMPARISON:  None Available. FINDINGS: No acute fractures are identified. No osseous erosions. Contraction deformity involving the second toe. Soft tissue swelling about the forefoot. 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 IMPRESSION: No definite radiographic evidence for acute osteomyelitis. Contractures involving the toes and hallux valgus deformity. Electronically Signed   By: Michaeline Blanch M.D.   On: 05/16/2024 16:05   DG Foot 2 Views Right Result Date: 05/16/2024 CLINICAL DATA:  Evaluate for osteomyelitis EXAM: RIGHT FOOT - 2 VIEW  COMPARISON:  None Available. FINDINGS: No acute fractures are identified. No osseous erosions. Likely plantar dislocation of the second digit middle phalanx at the PIP joint. Marked metatarsus primus varus and hallux valgus deformity  at the first MTP joint. Soft tissue swelling about the forefoot. IMPRESSION: 1. Plantar dislocation of the second digit middle phalanx at the PIP joint. 2.  Metatarsus primus varus and hallux valgus. 3.  No definite radiographic evidence for acute osteomyelitis. Electronically Signed   By: Michaeline Blanch M.D.   On: 05/16/2024 16:03    Assessment and Plan Ahlivia Salahuddin is a 63 y.o. female with medical history significant of essential hypertension,  GERD who has not been on any medications at home but has apparently been gradually declining not eating or drinking for weeks.    Starvation ketoacidosis severe hypoalbuminemia electrolyte abnormality with low potassium, low magnesium  -- patient received IV fluids.-- Pharmacy to replace electrolytes -- dietitian to see patient -- multivitamin, PO thiamine   Severe iron  deficiency anemia -- patient denies any melena or weight loss -- G.I. consultation with Dr. Maryruth -- came in with hemoglobin of 5.5-- will give 2 unit transfusion -- IV iron  -- PO iron  -- follow-up [id number]--start po b12 tabs  Severe vitamin D  def --Vit D3--19.9--start cholecalciferol   Large intra-thoracic hiatal hernia/Gerd poor appetite -- start on Protonix  -- encourage full liquid diet --EGD No endoscopic esophageal abnormality to explain                         patient's dysphagia.                        - Large hiatal hernia.                        - Normal examined duodenum.                        - No specimens collected. --per GI--inoperable and pt will not tolerate colon prep--consider out pt if needed  Severe Intertrigo (under the breast/groin) --poor hygiene/unable to care for self --Nystatin  powder --Diflucan  for 7 days +IV  rocephin   Bilateral LE ulcers --follow WOC recs --On IV abxs  Abnormal CT chest--?left inferior breast skin thickening and bilateral LN enlargement --d/w Radiology--no breast mass seen on CT chest. Appears Reactive LN enlargement due to severe intertrigo --pt to get mammogram/US  breast as out pt  TOC for d/c planning PT/OT to see pt  Procedures:EGD Family communication :none Consults : G.I. CODE STATUS: full DVT Prophylaxis :SCD (severe anemia) Level of care: Med-Surg Status is: Inpatient Remains inpatient appropriate because: severe anemia, bilateral lower extremity for ulcers, electrolyte abnormality    TOTAL TIME TAKING CARE OF THIS PATIENT: 35 minutes.  >50% time spent on counselling and coordination of care  Note: This dictation was prepared with Dragon dictation along with smaller phrase technology. Any transcriptional errors that result from this process are unintentional.  Leita Blanch M.D    [facility]   CC: Primary care physician; Patient, No Pcp Per

## 2024-05-18 NOTE — Progress Notes (Signed)
 PT Cancellation Note  Patient Details Name: Cathy Grant MRN: [id number] DOB: [birth year]   Cancelled Treatment:    Reason Eval/Treat Not Completed: Patient at procedure or test/unavailable (Attempted PT evaluation but patient was not in room undergoing procedure. Will re-attempt at later time/date as approrpiate)  Camie SAUNDERS. Juli, PT, DPT 05/18/24, 12:39 PM

## 2024-05-18 NOTE — Anesthesia Preprocedure Evaluation (Addendum)
 Anesthesia Evaluation  Patient identified by MRN, date of birth, ID band Patient awake    Reviewed: Allergy & Precautions, NPO status , Patient's Chart, lab work & pertinent test results  History of Anesthesia Complications Negative for: history of anesthetic complications  Airway Mallampati: III  TM Distance: >3 FB Neck ROM: full    Dental  (+) Poor Dentition, Missing   Pulmonary neg pulmonary ROS   Pulmonary exam normal        Cardiovascular hypertension, Normal cardiovascular exam     Neuro/Psych negative neurological ROS  negative psych ROS   GI/Hepatic Neg liver ROS, hiatal hernia,GERD  ,,Failure to thrive, malnutrition    Endo/Other  negative endocrine ROS    Renal/GU ARFRenal disease  negative genitourinary   Musculoskeletal   Abdominal   Peds  Hematology  (+) Blood dyscrasia, anemia   Anesthesia Other Findings Past Medical History: No date: Hypertension  History reviewed. No pertinent surgical history.  BMI    Body Mass Index: 30.16 kg/m      Reproductive/Obstetrics negative OB ROS                              Anesthesia Physical Anesthesia Plan  ASA: 3  Anesthesia Plan: General   Post-op Pain Management:    Induction: Intravenous  PONV Risk Score and Plan: Propofol  infusion and TIVA  Airway Management Planned: Natural Airway and Nasal Cannula  Additional Equipment:   Intra-op Plan:   Post-operative Plan:   Informed Consent: I have reviewed the patients History and Physical, chart, labs and discussed the procedure including the risks, benefits and alternatives for the proposed anesthesia with the patient or authorized representative who has indicated his/her understanding and acceptance.     Dental Advisory Given  Plan Discussed with: Anesthesiologist, CRNA and Surgeon  Anesthesia Plan Comments: (Patient consented for risks of anesthesia including but  not limited to:  - adverse reactions to medications - risk of airway placement if required - damage to eyes, teeth, lips or other oral mucosa - nerve damage due to positioning  - sore throat or hoarseness - Damage to heart, brain, nerves, lungs, other parts of body or loss of life  Patient voiced understanding and assent.)         Anesthesia Quick Evaluation

## 2024-05-18 NOTE — Op Note (Signed)
 [facility] Patient Name: Cathy Grant Procedure Date: 05/18/2024 10:47 AM MRN: [id number] Account #: [id number] Date of Birth: [birth year] Admit Type: Inpatient Age: 63 Room: [facility] ENDO ROOM 4 Gender: Female Note Status: Finalized Instrument Name: Upper GI Scope [id number] [id number] Procedure:             Upper GI endoscopy Indications:           Dysphagia, Abnormal CT of the GI tract Providers:             Ole Schick MD, MD Referring MD:          No Local Md, MD (Referring MD) Medicines:             Monitored Anesthesia Care Complications:         No immediate complications. Procedure:             Pre-Anesthesia Assessment:                        - Prior to the procedure, a History and Physical was                         performed, and patient medications and allergies were                         reviewed. The patient is competent. The risks and                         benefits of the procedure and the sedation options and                         risks were discussed with the patient. All questions                         were answered and informed consent was obtained.                         Patient identification and proposed procedure were                         verified by the physician, the nurse, the                         anesthesiologist, the anesthetist and the technician                         in the endoscopy suite. Mental Status Examination:                         alert and oriented. Airway Examination: normal                         oropharyngeal airway and neck mobility. Respiratory                         Examination: clear to auscultation. CV Examination:                         normal. Prophylactic Antibiotics: The patient does not  require prophylactic antibiotics. Prior                         Anticoagulants: The patient has taken no anticoagulant                         or antiplatelet agents. ASA Grade  Assessment: III - A                         patient with severe systemic disease. After reviewing                         the risks and benefits, the patient was deemed in                         satisfactory condition to undergo the procedure. The                         anesthesia plan was to use monitored anesthesia care                         (MAC). Immediately prior to administration of                         medications, the patient was re-assessed for adequacy                         to receive sedatives. The heart rate, respiratory                         rate, oxygen saturations, blood pressure, adequacy of                         pulmonary ventilation, and response to care were                         monitored throughout the procedure. The physical                         status of the patient was re-assessed after the                         procedure.                        After obtaining informed consent, the endoscope was                         passed under direct vision. Throughout the procedure,                         the patient's blood pressure, pulse, and oxygen                         saturations were monitored continuously. The Endoscope                         was introduced through the mouth, and advanced to the  second part of duodenum. The upper GI endoscopy was                         accomplished without difficulty. The patient tolerated                         the procedure well. Findings:      No endoscopic abnormality was evident in the esophagus to explain the       patient's complaint of dysphagia.      A large hiatal hernia was present. Very difficult to delineate anatomy       due to know positioning of the stomach in the chest.      The examined duodenum was normal. Impression:            - No endoscopic esophageal abnormality to explain                         patient's dysphagia.                        - Large hiatal  hernia.                        - Normal examined duodenum.                        - No specimens collected. Recommendation:        - Return patient to hospital ward for ongoing care.                        - Advance diet as tolerated.                        - Continue present medications.                        - No plans on inpatient colonoscopy as patient                         suspects she will not tolerate the prep. Can consider                         as an outpatient once her conditioning improves. Procedure Code(s):     --- Professional ---                        [phone], Esophagogastroduodenoscopy, flexible,                         transoral; diagnostic, including collection of                         specimen(s) by brushing or washing, when performed                         (separate procedure) Diagnosis Code(s):     --- Professional ---                        R13.10, Dysphagia, unspecified  K44.9, Diaphragmatic hernia without obstruction or                         gangrene                        R93.3, Abnormal findings on diagnostic imaging of                         other parts of digestive tract CPT copyright 2022 [employer]. All rights reserved. The codes documented in this report are preliminary and upon coder review may  be revised to meet current compliance requirements. Ole Schick MD, MD 05/18/2024 11:34:30 AM Number of Addenda: 0 Note Initiated On: 05/18/2024 10:47 AM Estimated Blood Loss:  Estimated blood loss: none.      [facility]

## 2024-05-18 NOTE — Progress Notes (Signed)
 OT Cancellation Note  Patient Details Name: Jenet Durio MRN: [id number] DOB: [birth year]   Cancelled Treatment:    Reason Eval/Treat Not Completed: Other (comment) (pt is OTF for testing/procedure, OT will re attempt as able)  Therisa Sheffield, OTD OTR/L  05/18/24, 11:37 AM

## 2024-05-19 DIAGNOSIS — E8729 Other acidosis: Secondary | ICD-10-CM | POA: Diagnosis not present

## 2024-05-19 DIAGNOSIS — T730XXA Starvation, initial encounter: Secondary | ICD-10-CM | POA: Diagnosis not present

## 2024-05-19 LAB — MAGNESIUM: Magnesium: 1.6 mg/dL — ABNORMAL LOW (ref 1.7–2.4)

## 2024-05-19 LAB — BASIC METABOLIC PANEL WITH GFR
Anion gap: 6 (ref 5–15)
BUN: 9 mg/dL (ref 8–23)
CO2: 23 mmol/L (ref 22–32)
Calcium: 7.6 mg/dL — ABNORMAL LOW (ref 8.9–10.3)
Chloride: 109 mmol/L (ref 98–111)
Creatinine, Ser: 0.83 mg/dL (ref 0.44–1.00)
GFR, Estimated: >60 mL/min (ref >60)
Glucose, Bld: 87 mg/dL (ref 70–99)
Potassium: 3.5 mmol/L (ref 3.5–5.1)
Sodium: 138 mmol/L (ref 135–145)

## 2024-05-19 MED ORDER — POTASSIUM CHLORIDE CRYS ER 20 MEQ PO TBCR: 40.0000 meq | EXTENDED_RELEASE_TABLET | Freq: Once | ORAL | Status: DC

## 2024-05-19 MED ORDER — MAGNESIUM SULFATE 2 GM/50ML IV SOLN
2.0000 g | Freq: Once | INTRAVENOUS | Status: AC
  Administered 2024-05-19: 2 g via INTRAVENOUS
  Filled 2024-05-19: qty 50

## 2024-05-19 MED ORDER — ENSURE PLUS HIGH PROTEIN PO LIQD
237.0000 mL | Freq: Two times a day (BID) | ORAL | Status: DC
  Administered 2024-05-19 – 2024-05-23 (×5): 237 mL via ORAL

## 2024-05-19 MED ORDER — POTASSIUM CHLORIDE 20 MEQ PO PACK
40.0000 meq | PACK | Freq: Once | ORAL | Status: AC
  Administered 2024-05-19: 40 meq via ORAL
  Filled 2024-05-19: qty 2

## 2024-05-19 NOTE — Progress Notes (Signed)
 Received referral from bedside nurse. Introduced patient to role of Statistician. Intake questions completed.  Patient currently living in a boarding house at [address]. She states she has lived there for about a year and half. Previously she was living at the homeless shelter for about 7 months. She was placed in the boarding house via their housing program. Patient does have an eviction on her record from not quite 3 years ago. As such, she will not be able to apply for housing authority assistance until 3 years have passed.  Patient states she previously got paid via [employer] funding to work at [employer], after which she started receiving her SSI check in the amount of 641.00. She previously received food stamps; however, something went wrong and she no longer receives them. Patient admits to having not eaten anything in weeks prior to presenting to the hospital.  Patient states she does NOT drive, and has difficulty with ambulation. Current recs are for SNF for short term rehab. Patient states following this, IF she improves, she would be open to a GH or ALF.  Encouraged to call with questions or concerns. Will follow along for dispo

## 2024-05-19 NOTE — Consult Note (Signed)
 PHARMACY CONSULT NOTE - ELECTROLYTES  Pharmacy Consult for Electrolyte Monitoring and Replacement   Recent Labs: Height: 5' 2 (157.5 cm) Weight: 74.8 kg (164 lb 14.5 oz) IBW/kg (Calculated) : 50.1 Estimated Creatinine Clearance: 65.7 mL/min (by C-G formula based on SCr of 0.83 mg/dL). Potassium (mmol/L)  Date Value  05/19/2024 3.5   Magnesium  (mg/dL)  Date Value  90/92/7974 1.6 (L)   Calcium (mg/dL)  Date Value  90/92/7974 7.6 (L)   Albumin (g/dL)  Date Value  90/94/7974 1.8 (L)   Sodium (mmol/L)  Date Value  05/19/2024 138   Corrected Ca: 9.4 mg/dL   ( ca 7.6  Alb 1.8)  Assessment  Cathy Grant is a 63 y.o. female presenting with primary problem of starvation ketoacidosis. PMH significant for essential hypertension, suspected breast cancer, and GERD. Patient mentioned having poor appetite at home. CT A/P showed hiatal hernia with anterior stomach. Pharmacy has been consulted to monitor and replace electrolytes.  Diet:  soft MIVF:none Pertinent medications: N/A  Goal of Therapy: Electrolytes WNL   Plan:  K 3.5  Will order KCL 40 meq po x1  Mag 1.6  will order magnesium  sulfate 2 gm IV x1 Will check electrolytes in the AM   Thank you for allowing pharmacy to be a part of this patient's care.   Allean Haas PharmD Clinical Pharmacist 05/19/2024

## 2024-05-19 NOTE — Evaluation (Addendum)
 Occupational Therapy Evaluation Patient Details Name: Sarinity Dicicco MRN: [id number] DOB: 1961-03-08 Today's Date: 05/19/2024   History of Present Illness   Pt is a 63 year old female admitted with Starvation ketoacidosis,  severe hypoalbuminemia,  electrolyte abnormality with low potassium, low magnesium , severe iron  deficiency anemia, severe vitamin D  def, Large intra-thoracic hiatal hernia/Gerd  poor appetite, Severe Intertrigo (under the breast/groin), Bilateral LE ulcers, Abnormal CT chest    PMH significant for essential hypertension,  GERD     Clinical Impressions Chart reviewed to date, pt greeted semi supine in bed, tearful but agreeable to OT evaluation. PTA pt reports she was MOD I-I for ADL up until about 1 month ago and she has had increasingly more difficulty getting dressed and mobilizing. She reports she has been washing at the sink in the half bath downstairs (tub/shower is upstairs). Pt presents with deficits in strength, endurance, activity tolerance, balance, affecting safe and optimal ADL completion. She performs bed mobility with MIN-MOD A, STS with MIN A, lateral  steps up the bed with RW with MIN A. Seated grooming tasks completed with supervision and LB dressing completed with MAX A. She reports dizziness with position changes, worse when she stands. Pt is noted to be in significant cervical flexion while sitting, she is unable to actively extend to neutral. She is able to tolerate neutral in a supine position however. Pt is performing ADL/functional mobility below PLOF, will benefit from acute OT to address functional deficits and to facilitate optimal ADL/functional mobility performance. Pt      If plan is discharge home, recommend the following:   A little help with walking and/or transfers;A little help with bathing/dressing/bathroom;Help with stairs or ramp for entrance;Assistance with cooking/housework;Assist for transportation     Functional Status Assessment    Patient has had a recent decline in their functional status and demonstrates the ability to make significant improvements in function in a reasonable and predictable amount of time.     Equipment Recommendations   BSC/3in1;Other (comment) (2WW)     Recommendations for Other Services         Precautions/Restrictions   Precautions Precautions: Fall Recall of Precautions/Restrictions: Intact Restrictions Weight Bearing Restrictions Per Provider Order: No     Mobility Bed Mobility Overal bed mobility: Needs Assistance Bed Mobility: Supine to Sit, Sit to Supine     Supine to sit: Min assist, HOB elevated Sit to supine: Mod assist (assist for BLE)        Transfers Overall transfer level: Needs assistance Equipment used: Rolling walker (2 wheels) Transfers: Sit to/from Stand Sit to Stand: Min assist (from regular height hospital bed)           General transfer comment: lateral steps up the bed with RW to the L with MIN A      Balance Overall balance assessment: Needs assistance Sitting-balance support: Feet supported Sitting balance-Leahy Scale: Good     Standing balance support: Bilateral upper extremity supported, Reliant on assistive device for balance, During functional activity Standing balance-Leahy Scale: Fair                             ADL either performed or assessed with clinical judgement   ADL Overall ADL's : Needs assistance/impaired Eating/Feeding: Set up   Grooming: Supervision/safety;Sitting;Oral care       Lower Body Bathing: Maximal assistance       Lower Body Dressing: Maximal assistance;Sitting/lateral leans Lower Body Dressing Details (  indicate cue type and reason): donn/doff socks Toilet Transfer: Minimal assistance;Rolling walker (2 wheels) Toilet Transfer Details (indicate cue type and reason): simulated                 Vision Patient Visual Report: No change from baseline       Perception          Praxis         Pertinent Vitals/Pain Pain Assessment Pain Assessment: No/denies pain     Extremity/Trunk Assessment Upper Extremity Assessment Upper Extremity Assessment: Generalized weakness   Lower Extremity Assessment Lower Extremity Assessment: Defer to PT evaluation   Cervical / Trunk Assessment Cervical / Trunk Assessment: Kyphotic (pt is able to come to neutral when in supine, but unable extend from flexion to neutral while in sitting)   Communication Communication Communication: No apparent difficulties   Cognition Arousal: Alert Behavior During Therapy: WFL for tasks assessed/performed Cognition: Cognition impaired         Attention impairment (select first level of impairment): Selective attention Executive functioning impairment (select all impairments): Problem solving OT - Cognition Comments: will continue to assess                 Following commands: Intact       Cueing  General Comments   Cueing Techniques: Verbal cues  Bp reports dizziness, worse with position changes; she is noted to be orthostatic with PT earlier; BLE wounds covered with mepilex   Exercises Other Exercises Other Exercises: edu re role of OT, role of rehab, safe ADL completion   Shoulder Instructions      Home Living Family/patient expects to be discharged to:: Private residence (boarding house) Living Arrangements: Non-relatives/Friends Available Help at Discharge:  (pt reports no assist) Type of Home: House       Home Layout: One level     Bathroom Shower/Tub: Tub/shower unit (upstairs, has been doing sink baths downstairs recently)         Home Equipment: Cane - single point;Rollator (4 wheels)   Additional Comments: walkis to the bus stop, etc.      Prior Functioning/Environment Prior Level of Function : Independent/Modified Independent             Mobility Comments: recently recieved a rollator (charity) ADLs Comments: generally MOD I-I for  ADL/IADL however over the last month has had increasing weakness/inability to care for herself    OT Problem List: Decreased strength;Decreased activity tolerance;Decreased knowledge of use of DME or AE;Impaired balance (sitting and/or standing);Decreased safety awareness   OT Treatment/Interventions: Self-care/ADL training;Energy conservation;Balance training;Therapeutic exercise;DME and/or AE instruction;Patient/family education;Therapeutic activities      OT Goals(Current goals can be found in the care plan section)   Acute Rehab OT Goals Patient Stated Goal: get stronger OT Goal Formulation: With patient Time For Goal Achievement: 06/02/24 Potential to Achieve Goals: Good ADL Goals Pt Will Perform Grooming: with modified independence;sitting;standing Pt Will Perform Lower Body Dressing: with modified independence;sitting/lateral leans;sit to/from stand Pt Will Transfer to Toilet: with modified independence;ambulating Pt Will Perform Toileting - Clothing Manipulation and hygiene: with modified independence;sitting/lateral leans;sit to/from stand   OT Frequency:  Min 3X/week    Co-evaluation              AM-PAC OT 6 Clicks Daily Activity     Outcome Measure Help from another person eating meals?: None Help from another person taking care of personal grooming?: None Help from another person toileting, which includes using toliet, bedpan, or urinal?: A  Little Help from another person bathing (including washing, rinsing, drying)?: A Lot Help from another person to put on and taking off regular upper body clothing?: None Help from another person to put on and taking off regular lower body clothing?: A Lot 6 Click Score: 19   End of Session Equipment Utilized During Treatment: Rolling walker (2 wheels) Nurse Communication: Mobility status  Activity Tolerance: Treatment limited secondary to medical complications (Comment) (dizziness) Patient left: in bed;with call  bell/phone within reach;with bed alarm set;with nursing/sitter in room;with family/visitor present  OT Visit Diagnosis: Other abnormalities of gait and mobility (R26.89);Muscle weakness (generalized) (M62.81)                Time: 8868-8857 OT Time Calculation (min): 11 min Charges:  OT General Charges $OT Visit: 1 Visit OT Evaluation $OT Eval Low Complexity: 1 Low  Therisa Sheffield, OTD OTR/L  05/19/24, 12:35 PM

## 2024-05-19 NOTE — TOC Progression Note (Signed)
 Transition of Care [facility]) - Progression Note    Patient Details  Name: Cathy Grant MRN: [id number] Date of Birth: 16-Sep-1960  Transition of Care [facility]) CM/SW Contact  [place] L Fumiko Cham, [place] Phone Number: 05/19/2024, 12:52 PM  Clinical Narrative:     CSW met with patient to discuss recommendations for SNF. Patient was agreeable to SNF placement. Patient advised of no preferences. She reported that CSW should locate beds based on patient residential zip code.   FL2 completed. Bed search initiated.                     Expected Discharge Plan and Services                                               Social Drivers of Health (SDOH) Interventions SDOH Screenings   Food Insecurity: Food Insecurity Present (05/18/2024)  Housing: High Risk (05/18/2024)  Transportation Needs: Unmet Transportation Needs (05/18/2024)  Utilities: Not At Risk (05/18/2024)  Tobacco Use: Low Risk  (05/16/2024)    Readmission Risk Interventions     No data to display

## 2024-05-19 NOTE — Evaluation (Signed)
 Physical Therapy Evaluation Patient Details Name: Jarelly Rinck MRN: [id number] DOB: 30-Sep-1960 Today's Date: 05/19/2024  History of Present Illness  Pt is a 63 year old female admitted with Starvation ketoacidosis,  severe hypoalbuminemia,  electrolyte abnormality with low potassium, low magnesium , severe iron  deficiency anemia, severe vitamin D  def, Large intra-thoracic hiatal hernia/Gerd  poor appetite, Severe Intertrigo (under the breast/groin), Bilateral LE ulcers, Abnormal CT chest    PMH significant for essential hypertension,  GERD  Clinical Impression  Pt globally weak and mildly limited by acute pain in neck, is able to come to EOB with technique cues and use of bed rail superimposed over max effort, pt also has difficulty with being able to consistently come to standing but is limited but does do better when cued for ergonomic hand placement. Pt is dizzy upon sitting, remains throughout, but consistently worse in standing with orthostatic BP correlating. Pt assisted to recliner, then later to [facility]. Pt able to stand for BP safely despite dizziness. Pt also able to demonstrate safe sidestepping along bedside. I do not feel pt could manage her own ADL mobility in current condition, nor access 2nd floor full bath for bathing, I suspect also any issue prior to admission. Will continue to follow. Pt is a good candidate for STR prior to return to living alone.     If plan is discharge home, recommend the following: A lot of help with walking and/or transfers;A lot of help with bathing/dressing/bathroom   Can travel by private vehicle   No    Equipment Recommendations Rollator (4 wheels)  Recommendations for Other Services       Functional Status Assessment Patient has had a recent decline in their functional status and demonstrates the ability to make significant improvements in function in a reasonable and predictable amount of time.     Precautions / Restrictions  Precautions Precautions: Fall Recall of Precautions/Restrictions: Intact Restrictions Weight Bearing Restrictions Per Provider Order: No      Mobility  Bed Mobility Overal bed mobility: Needs Assistance Bed Mobility: Supine to Sit     Supine to sit: Min assist     General bed mobility comments: very labored painful, but does better after rolling onto side and using bed rail.    Transfers Overall transfer level: Needs assistance Equipment used: Rolling walker (2 wheels) Transfers: Sit to/from Stand, Bed to chair/wheelchair/BSC Sit to Stand: Supervision   Step pivot transfers: Supervision       General transfer comment: multiple step pivot transfers in session, but additional AMB deferred due to orthostatic intolerance.    Ambulation/Gait                  Stairs            Wheelchair Mobility     Tilt Bed    Modified Rankin (Stroke Patients Only)       Balance                                             Pertinent Vitals/Pain      Home Living Family/patient expects to be discharged to:: Private residence (boarding house) Living Arrangements: Non-relatives/Friends Available Help at Discharge:  (pt reports no assist) Type of Home: House         Home Layout: One level Home Equipment: Cane - single point;Rollator (4 wheels) Additional Comments: walkis to the bus  stop, etc.    Prior Function Prior Level of Function : Independent/Modified Independent             Mobility Comments: recently recieved a rollator (charity) ADLs Comments: generally MOD I-I for ADL/IADL however over the last month has had increasing weakness/inability to care for herself     Extremity/Trunk Assessment   Upper Extremity Assessment Upper Extremity Assessment: Generalized weakness    Lower Extremity Assessment Lower Extremity Assessment: Defer to PT evaluation    Cervical / Trunk Assessment Cervical / Trunk Assessment: Kyphotic (pt  is able to come to neutral when in supine, but unable extend from flexion to neutral while in sitting)  Communication   Communication Communication: No apparent difficulties    Cognition                                         Cueing       General Comments General comments (skin integrity, edema, etc.): Bp reports dizziness, worse with position changes; she is noted to be orthostatic with PT earlier; BLE wounds covered with mepilex    Exercises     Assessment/Plan    PT Assessment Patient needs continued PT services  PT Problem List Decreased strength;Decreased range of motion;Decreased activity tolerance;Decreased balance;Decreased mobility       PT Treatment Interventions DME instruction;Gait training;Stair training;Functional mobility training;Therapeutic activities;Therapeutic exercise;Balance training    PT Goals (Current goals can be found in the Care Plan section)  Acute Rehab PT Goals Patient Stated Goal: regain baseline mobility and stop falling. PT Goal Formulation: With patient Time For Goal Achievement: 06/02/24 Potential to Achieve Goals: Good    Frequency Min 2X/week     Co-evaluation               AM-PAC PT 6 Clicks Mobility  Outcome Measure Help needed turning from your back to your side while in a flat bed without using bedrails?: A Lot Help needed moving from lying on your back to sitting on the side of a flat bed without using bedrails?: A Lot Help needed moving to and from a bed to a chair (including a wheelchair)?: A Lot Help needed standing up from a chair using your arms (e.g., wheelchair or bedside chair)?: A Lot Help needed to walk in hospital room?: A Lot Help needed climbing 3-5 steps with a railing? : A Lot 6 Click Score: 12    End of Session   Activity Tolerance: Patient tolerated treatment well;Treatment limited secondary to medical complications (Comment);Patient limited by pain Patient left: in chair;with  call bell/phone within reach;with nursing/sitter in room Nurse Communication: Mobility status PT Visit Diagnosis: Difficulty in walking, not elsewhere classified (R26.2);Muscle weakness (generalized) (M62.81);Dizziness and giddiness (R42);Other abnormalities of gait and mobility (R26.89)    Time: 8958-8884 PT Time Calculation (min) (ACUTE ONLY): 34 min   Charges:   PT Evaluation $PT Eval Moderate Complexity: 1 Mod PT Treatments $Therapeutic Activity: 8-22 mins PT General Charges $$ ACUTE PT VISIT: 1 Visit    1:37 PM, 05/19/24 Peggye JAYSON Linear, PT, DPT Physical Therapist - [facility]  [phone] (ASCOM)      Delesa Kawa C 05/19/2024, 1:28 PM

## 2024-05-19 NOTE — Progress Notes (Signed)
 [facility]  - [place] [facility]   PATIENT NAME: Cathy Grant    MR#:  [id number]  DATE OF BIRTH:  1961/04/01  SUBJECTIVE:  patient attempting to work with PT when seen earlier. She overall looks better improving better and tolerating diet somewhat full liquid. I advised to try eat soft diet.  VITALS:  Blood pressure 118/66, pulse 94, temperature 98.7 F (37.1 C), resp. rate 14, height 5' 2 (1.575 m), weight 74.8 kg, SpO2 99%.  PHYSICAL EXAMINATION:   GENERAL:  63 y.o.-year-old patient with no acute distress. obese LUNGS: Normal breath sounds bilaterally, no wheezing CARDIOVASCULAR: S1, S2 normal. No murmur   ABDOMEN: Soft, nontender, nondistended. Bowel sounds present.  Significant intertrigo EXTREMITIES: On admission    NEUROLOGIC: nonfocal  patient is alert and awake SKIN: as above  LABORATORY PANEL:  CBC Recent Labs  Lab 05/17/24 0530 05/18/24 0334 05/18/24 1227  WBC 8.5  --   --   HGB 5.5*   < > 10.2*  HCT 18.7*   < > 32.4*  PLT 523*  --   --    < > = values in this interval not displayed.    Chemistries  Recent Labs  Lab 05/17/24 0530 05/18/24 0334 05/19/24 0512  NA 132*   < > 138  K 2.8*   < > 3.5  CL 101   < > 109  CO2 22   < > 23  GLUCOSE 110*   < > 87  BUN 24*   < > 9  CREATININE 1.19*   < > 0.83  CALCIUM 7.5*   < > 7.6*  MG  --   --  1.6*  AST 20  --   --   ALT 12  --   --   ALKPHOS 54  --   --   BILITOT 0.6  --   --    < > = values in this interval not displayed.   Cardiac Enzymes No results for input(s): TROPONINI in the last 168 hours. RADIOLOGY:  No results found.   Assessment and Plan [name] is a 63 y.o. female with medical history significant of essential hypertension,  GERD who has not been on any medications at home but has apparently been gradually declining not eating or drinking for weeks.    Starvation ketoacidosis severe hypoalbuminemia electrolyte abnormality with low potassium, low  magnesium  -- patient received IV fluids.-- Pharmacy to replace electrolytes -- dietitian to see patient -- multivitamin, PO thiamine   Severe iron  deficiency anemia -- patient denies any melena or weight loss -- G.I. consultation with Dr. Maryruth -- came in with hemoglobin of 5.5-- will give 2 unit transfusion-- 10.2 -- IV iron  -- PO iron  -- follow-up B12-342--start po b12 tabs  Severe vitamin D  def --Vit D3--19.9--start cholecalciferol   Large intra-thoracic hiatal hernia/Gerd poor appetite -- start on Protonix  -- encourage full liquid diet --EGD No endoscopic esophageal abnormality to explain                         patient's dysphagia.                        - Large hiatal hernia.                        - Normal examined duodenum.                        -  No specimens collected. --per GI--inoperable and pt will not tolerate colon prep--consider out pt if needed  Severe Intertrigo (under the breast/groin) --poor hygiene/unable to care for self --Nystatin  powder --Diflucan  for 7 days +IV rocephin   Bilateral LE ulcers --follow [facility]OC recs --On IV abxs  Abnormal CT chest--?left inferior breast skin thickening and bilateral LN enlargement --d/w Radiology--no breast mass seen on CT chest. Appears Reactive LN enlargement due to severe intertrigo --pt to get mammogram/US  breast as out pt  TOC for d/c planning PT/OT recommends rehab. TOC informed  Procedures:EGD Family communication :none Consults : G.I. CODE STATUS: full DVT Prophylaxis :SCD (severe anemia) Level of care: Med-Surg Status is: Inpatient Remains inpatient appropriate because: severe anemia, bilateral lower extremity for ulcers, electrolyte abnormality    TOTAL TIME TAKING CARE OF THIS PATIENT: 35 minutes.  >50% time spent on counselling and coordination of care  Note: This dictation was prepared with Dragon dictation along with smaller phrase technology. Any transcriptional errors that result from this  process are unintentional.  Leita Blanch M.D    [facility]   CC: Primary care physician; Patient, No Pcp Per

## 2024-05-19 NOTE — [place] FL2 (Signed)
 [place]  MEDICAID FL2 LEVEL OF CARE FORM     IDENTIFICATION  Patient Name: Cathy Grant Birthdate: May 02, 1961 Sex: female Admission Date (Current Location): 05/16/2024  [facility] and [place] Number:  Chiropodist and Address:  [facility], [address] [place], [place] [address]      Provider Number: [id number]  Attending Physician Name and Address:  Tobie Calix, MD  Relative Name and Phone Number:  Shade Rivenbark [phone]    Current Level of Care: Hospital Recommended Level of Care: Skilled Nursing Facility Prior Approval Number:    Date Approved/Denied: 05/19/24 PASRR Number: [id number]  Discharge Plan: SNF    Current Diagnoses: Patient Active Problem List   Diagnosis Date Noted   Cellulitis 05/17/2024   Hypokalemia 05/17/2024   Starvation ketoacidosis 05/16/2024   Essential hypertension 05/16/2024   Normocytic anemia 05/16/2024   Leucocytosis 05/16/2024   AKI (acute kidney injury) (HCC) 05/16/2024   Hyponatremia 05/16/2024   FTT (failure to thrive) in adult 05/16/2024   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 Yes; Fluid consistency: Thin: Bland starting at 09/06 1537)  AMBULATORY STATUS COMMUNICATION OF NEEDS Skin   Extensive Assist Verbally Other (Comment) (Diaphoretic)                       Personal Care Assistance Level of Assistance  Bathing, Feeding, Dressing Bathing Assistance: Limited assistance Feeding assistance: Limited assistance Dressing Assistance: Limited assistance     Functional Limitations Info  Sight, Hearing, Speech Sight Info: Adequate Hearing Info: Adequate Speech Info: Adequate    SPECIAL CARE  FACTORS FREQUENCY  Blood pressure, PT (By licensed PT), OT (By licensed OT) Blood Pressure Frequency: Orthostatic   PT Frequency: 7x OT Frequency: 7x            Contractures Contractures Info: Present    Additional Factors Info  Code Status, Allergies Code Status Info: FULL Allergies Info: NKA           Current Medications (05/19/2024):  This is the current hospital active medication list Current Facility-Administered Medications  Medication Dose Route Frequency Provider Last Rate Last Admin   acetaminophen  (TYLENOL ) tablet 650 mg  650 mg Oral Q6H PRN Patel, Sona, MD       cefTRIAXone  (ROCEPHIN ) 2 g in sodium chloride  0.9 % 100 mL IVPB  2 g Intravenous Q24H Sim Re L, MD 200 mL/hr at 05/18/24 2206 2 g at 05/18/24 2206   cholecalciferol  (VITAMIN D3) 25 MCG (1000 UNIT) tablet 1,000 Units  1,000 Units Oral Daily Patel, Sona, MD   1,000 Units at 05/19/24 1028   cyanocobalamin  (VITAMIN B12) tablet 1,000 mcg  1,000 mcg Oral Daily Patel, Sona, MD   1,000 mcg at 05/19/24 1028   feeding supplement (ENSURE PLUS HIGH PROTEIN) liquid 237 mL  237 mL Oral BID BM Patel, Sona, MD       fluconazole  (DIFLUCAN ) tablet 100 mg  100 mg Oral Daily Patel, Sona, MD   100 mg at 05/19/24 1028   folic acid  (FOLVITE ) tablet 1 mg  1 mg Oral Daily Garba, Mohammad L, MD   1 mg at 05/19/24 1028   iron  polysaccharides (NIFEREX) capsule 150 mg  150 mg Oral Daily Patel, Sona, MD   150 mg at 05/17/24 1323   iron  sucrose (VENOFER ) 200 mg in sodium chloride  0.9 % 100 mL IVPB  200 mg Intravenous Daily Patel, Sona, MD 440 mL/hr at 05/17/24 1321 200 mg at 05/17/24 1321   leptospermum manuka honey (MEDIHONEY) paste 1 Application  1 Application Topical Daily Patel, Sona, MD   1 Application at 05/19/24 1029   mirtazapine  (REMERON ) tablet 15 mg  15 mg Oral QHS Patel, Sona, MD   15 mg at 05/18/24 2140   multivitamin with minerals tablet 1 tablet  1 tablet Oral Daily Garba, Mohammad L, MD   1 tablet at 05/19/24 1028    nystatin  (MYCOSTATIN /NYSTOP ) topical powder   Topical TID Patel, Sona, MD   Given at 05/19/24 1029   ondansetron  (ZOFRAN ) tablet 4 mg  4 mg Oral Q6H PRN Sim Emery CROME, MD       Or   ondansetron  (ZOFRAN ) injection 4 mg  4 mg Intravenous Q6H PRN Sim Emery CROME, MD       pantoprazole  (PROTONIX ) EC tablet 40 mg  40 mg Oral Daily Patel, Sona, MD   40 mg at 05/19/24 1028   senna-docusate (Senokot-S) tablet 2 tablet  2 tablet Oral QHS Patel, Sona, MD   2 tablet at 05/18/24 2140   thiamine  (VITAMIN B1) tablet 100 mg  100 mg Oral Daily Patel, Sona, MD   100 mg at 05/19/24 1028   traMADol  (ULTRAM ) tablet 50 mg  50 mg Oral Q6H PRN Patel, Sona, MD         Discharge Medications: Please see discharge summary for a list of discharge medications.  Relevant Imaging Results:  Relevant Lab Results:   Additional Information [id number]  [place] L Madalene Mick[place], [place]

## 2024-05-20 ENCOUNTER — Encounter: Payer: Self-pay | Admitting: Gastroenterology

## 2024-05-20 DIAGNOSIS — L0390 Cellulitis, unspecified: Secondary | ICD-10-CM | POA: Diagnosis not present

## 2024-05-20 DIAGNOSIS — D509 Iron deficiency anemia, unspecified: Secondary | ICD-10-CM

## 2024-05-20 DIAGNOSIS — R627 Adult failure to thrive: Secondary | ICD-10-CM | POA: Diagnosis not present

## 2024-05-20 DIAGNOSIS — N179 Acute kidney failure, unspecified: Secondary | ICD-10-CM | POA: Diagnosis not present

## 2024-05-20 DIAGNOSIS — E871 Hypo-osmolality and hyponatremia: Secondary | ICD-10-CM | POA: Diagnosis not present

## 2024-05-20 LAB — RENAL FUNCTION PANEL
Albumin: 1.6 g/dL — ABNORMAL LOW (ref 3.5–5.0)
Anion gap: 6 (ref 5–15)
BUN: 7 mg/dL — ABNORMAL LOW (ref 8–23)
CO2: 21 mmol/L — ABNORMAL LOW (ref 22–32)
Calcium: 7.7 mg/dL — ABNORMAL LOW (ref 8.9–10.3)
Chloride: 108 mmol/L (ref 98–111)
Creatinine, Ser: 0.73 mg/dL (ref 0.44–1.00)
GFR, Estimated: >60 mL/min (ref >60)
Glucose, Bld: 90 mg/dL (ref 70–99)
Phosphorus: 1.2 mg/dL — ABNORMAL LOW (ref 2.5–4.6)
Potassium: 4.4 mmol/L (ref 3.5–5.1)
Sodium: 135 mmol/L (ref 135–145)

## 2024-05-20 LAB — MAGNESIUM: Magnesium: 2.2 mg/dL (ref 1.7–2.4)

## 2024-05-20 MED ORDER — K PHOS MONO-SOD PHOS DI & MONO 155-852-130 MG PO TABS
500.0000 mg | ORAL_TABLET | Freq: Once | ORAL | Status: AC
  Administered 2024-05-20: 500 mg via ORAL
  Filled 2024-05-20: qty 2

## 2024-05-20 MED ORDER — SODIUM PHOSPHATES 45 MMOLE/15ML IV SOLN
30.0000 mmol | Freq: Once | INTRAVENOUS | Status: AC
  Administered 2024-05-20: 30 mmol via INTRAVENOUS
  Filled 2024-05-20: qty 10

## 2024-05-20 MED ORDER — CEPHALEXIN 500 MG PO CAPS
500.0000 mg | ORAL_CAPSULE | Freq: Four times a day (QID) | ORAL | Status: AC
  Administered 2024-05-20 – 2024-05-22 (×8): 500 mg via ORAL
  Filled 2024-05-20 (×8): qty 1

## 2024-05-20 NOTE — Progress Notes (Signed)
 [facility]  - [place] [facility]   PATIENT NAME: Cathy Grant    MR#:  [id number]  DATE OF BIRTH:  09-Jul-1961  SUBJECTIVE:  patient attempting to work with PT when seen earlier. She overall looks better improving better and tolerating diet --soft  VITALS:  Blood pressure 110/66, pulse 83, temperature 97.6 F (36.4 C), resp. rate 18, height 5' 2 (1.575 m), weight 74.8 kg, SpO2 97%.  PHYSICAL EXAMINATION:   GENERAL:  63 y.o.-year-old patient with no acute distress. obese LUNGS: Normal breath sounds bilaterally, no wheezing CARDIOVASCULAR: S1, S2 normal. No murmur   ABDOMEN: Soft, nontender, nondistended. Bowel sounds present.  Significant intertrigo--improving   EXTREMITIES: On admission   05/20/24    NEUROLOGIC: nonfocal  patient is alert and awake SKIN: as above  LABORATORY PANEL:  CBC Recent Labs  Lab 05/17/24 0530 05/18/24 0334 05/18/24 1227  WBC 8.5  --   --   HGB 5.5*   < > 10.2*  HCT 18.7*   < > 32.4*  PLT 523*  --   --    < > = values in this interval not displayed.    Chemistries  Recent Labs  Lab 05/17/24 0530 05/18/24 0334 05/20/24 0359  NA 132*   < > 135  K 2.8*   < > 4.4  CL 101   < > 108  CO2 22   < > 21*  GLUCOSE 110*   < > 90  BUN 24*   < > 7*  CREATININE 1.19*   < > 0.73  CALCIUM 7.5*   < > 7.7*  MG  --    < > 2.2  AST 20  --   --   ALT 12  --   --   ALKPHOS 54  --   --   BILITOT 0.6  --   --    < > = values in this interval not displayed.   Cardiac Enzymes No results for input(s): TROPONINI in the last 168 hours. RADIOLOGY:  No results found.   Assessment and Plan Cathy Grant is a 63 y.o. female with medical history significant of essential hypertension,  GERD who has not been on any medications at home but has apparently been gradually declining not eating or drinking for weeks.    Starvation ketoacidosis severe hypoalbuminemia electrolyte abnormality with low potassium, low magnesium , low  phosphorus -- patient received IV fluids.-- Pharmacy to replace electrolytes -- dietitian to see patient -- multivitamin, PO thiamine   Severe iron  deficiency anemia -- patient denies any melena or weight loss -- G.I. consultation with Dr. Maryruth -- came in with hemoglobin of 5.5-- will give 2 unit transfusion-- 10.2 -- IV iron  -- PO iron  -- follow-up B12-342--start po b12 tabs  Severe vitamin D  def --Vit D3--19.9--start cholecalciferol   Large intra-thoracic hiatal hernia/Gerd poor appetite -- start on Protonix  -- encourage full liquid diet --EGD No endoscopic esophageal abnormality to explain                         patient's dysphagia.                        - Large hiatal hernia.                        - Normal examined duodenum.                        -  No specimens collected. --per GI--inoperable and pt will not tolerate colon prep--consider out pt if needed  Severe Intertrigo (under the breast/groin) --poor hygiene/unable to care for self --Nystatin  powder --Diflucan  for 7 days +IV rocephin   Bilateral LE ulcers --follow WOC recs --On IV abxs  Abnormal CT chest--?left inferior breast skin thickening and bilateral LN enlargement --d/w Radiology--no breast mass seen on CT chest. Appears Reactive LN enlargement due to severe intertrigo --pt to get mammogram/US  breast as out pt  TOC for d/c planning PT/OT recommends rehab. TOC informed  Procedures:EGD Family communication :none Consults : G.I. CODE STATUS: full DVT Prophylaxis :SCD (severe anemia) Level of care: Med-Surg Status is: Inpatient Remains inpatient appropriate because:awaiting Rehab bed  Pt medically best at baseline for d/c    TOTAL TIME TAKING CARE OF THIS PATIENT: 35 minutes.  >50% time spent on counselling and coordination of care  Note: This dictation was prepared with Dragon dictation along with smaller phrase technology. Any transcriptional errors that result from this process are  unintentional.  Leita Blanch M.D    [facility]   CC: Primary care physician; Patient, No Pcp Per

## 2024-05-20 NOTE — Progress Notes (Incomplete)
 Mobility Specialist Progress Note:    05/20/24 1137  Mobility  Activity Ambulated with assistance  Level of Assistance Minimal assist, patient does 75% or more  Assistive Device Front wheel walker  Distance Ambulated (ft) 22 ft  Range of Motion/Exercises Active;All extremities  Activity Response Tolerated well  Mobility visit 1 Mobility  Mobility Specialist Start Time (ACUTE ONLY) 1106  Mobility Specialist Stop Time (ACUTE ONLY) 1134  Mobility Specialist Time Calculation (min) (ACUTE ONLY) 28 min   Pt receive din chair, agr  Sherrilee Ditty Mobility Specialist Please contact via SecureChat or  Rehab office at [phone]

## 2024-05-20 NOTE — TOC Progression Note (Addendum)
 Transition of Care [facility]) - Progression Note    Patient Details  Name: Cathy Grant MRN: [id number] Date of Birth: 1961-03-22  [facility] CM/SW Contact  Corean ONEIDA Haddock, RN Phone Number: 05/20/2024, 11:32 AM  Clinical Narrative:      Met with patient at bedside and presented bed offer She accepts bed at [facility] Accepted bed in HUB and notified Brittney with [facility] with [facility] to start auth      Update: Patient has medicaid,  requested Brittney with [facility] to have facility start auth              Expected Discharge Plan and Services                                               Social Drivers of Health (SDOH) Interventions SDOH Screenings   Food Insecurity: Food Insecurity Present (05/18/2024)  Housing: High Risk (05/18/2024)  Transportation Needs: Unmet Transportation Needs (05/18/2024)  Utilities: Not At Risk (05/18/2024)  Tobacco Use: Low Risk  (05/16/2024)    Readmission Risk Interventions     No data to display

## 2024-05-20 NOTE — Consult Note (Signed)
 PHARMACY CONSULT NOTE - ELECTROLYTES  Pharmacy Consult for Electrolyte Monitoring and Replacement   Recent Labs: Height: 5' 2 (157.5 cm) Weight: 74.8 kg (164 lb 14.5 oz) IBW/kg (Calculated) : 50.1 Estimated Creatinine Clearance: 68.2 mL/min (by C-G formula based on SCr of 0.73 mg/dL). Potassium (mmol/L)  Date Value  05/20/2024 4.4   Magnesium  (mg/dL)  Date Value  90/91/7974 2.2   Calcium (mg/dL)  Date Value  90/91/7974 7.7 (L)   Albumin (g/dL)  Date Value  90/91/7974 1.6 (L)   Phosphorus (mg/dL)  Date Value  90/91/7974 1.2 (L)   Sodium (mmol/L)  Date Value  05/20/2024 135   Corrected Ca: 9.6 mg/dL  Assessment  Cathy Grant is a 63 y.o. female presenting with primary problem of starvation ketoacidosis. PMH significant for essential hypertension, suspected breast cancer, and GERD. Patient mentioned having poor appetite at home. CT A/P showed hiatal hernia with anterior stomach. Pharmacy has been consulted to monitor and replace electrolytes.  Diet:  soft MIVF:none Pertinent medications: N/A  Goal of Therapy: Electrolytes WNL   Plan:  Phos 1.2, order NaPhos 30 mmol IV x 1 Will check electrolytes in the AM   Thank you for allowing pharmacy to be a part of this patient's care.   Kayla Mose Niels DOUGLAS, PharmD, BCPS Clinical Pharmacist 05/20/2024

## 2024-05-20 NOTE — Progress Notes (Signed)
 Mobility Specialist Progress Note:    05/20/24 1137  Orthostatic Sitting  BP- Sitting 127/74  Orthostatic Standing at 0 minutes  BP- Standing at 0 minutes 138/81  Mobility  Activity Ambulated with assistance  Level of Assistance Minimal assist, patient does 75% or more  Assistive Device Front wheel walker  Distance Ambulated (ft) 22 ft  Range of Motion/Exercises Active;All extremities  Activity Response Tolerated well  Mobility visit 1 Mobility  Mobility Specialist Start Time (ACUTE ONLY) 1106  Mobility Specialist Stop Time (ACUTE ONLY) 1134  Mobility Specialist Time Calculation (min) (ACUTE ONLY) 28 min   Pt received in chair, agreeable to mobility. Required MinA to stand and ambulate w/ RW. Tolerated well, VSS throughout. Pt c/o dizziness and fatigue near EOS. Returned to chair after small rest break, BP 142/92 (106). Symptoms began to subside, all needs met.   Sherrilee Ditty Mobility Specialist Please contact via Special educational needs teacher or  Rehab office at [phone]

## 2024-05-20 NOTE — Progress Notes (Signed)
 Occupational Therapy Treatment Patient Details Name: Cathy Grant MRN: [id number] DOB: 1961-03-07 Today's Date: 05/20/2024   History of present illness Pt is a 63 year old female admitted with starvation ketoacidosis,  severe hypoalbuminemia,  electrolyte abnormality with low potassium, low magnesium , severe iron  deficiency anemia, severe vitamin D  def, large intra-thoracic hiatal hernia/GERD,  poor appetite, severe intertrigo, bnilateral LE ulcers, abnormal CT chest. PMH significant for HTN and GERD.   OT comments  Pt making good progress towards goals. Received in recliner and participates in session focused on UB/LB ADLs. Pt completes UB dressing and bathing with up to MIN A for gown mgmt with cues required for IV line awareness, MIN A for STS using RW, and MAX A for LB bathing/dressing. Pt educated on performing hair care to avoid tangles, provided with comb and pt performing hair care seated. VSS, pt SOB after activity but O2 WNL on RA. Pt limited by deficits in strength, balance, tolerance to activity and anxiety. OT will continue to follow acutely.       If plan is discharge home, recommend the following:  A little help with walking and/or transfers;A little help with bathing/dressing/bathroom;Help with stairs or ramp for entrance;Assistance with cooking/housework;Assist for transportation   Equipment Recommendations  BSC/3in1;Other (comment)    Recommendations for Other Services      Precautions / Restrictions Precautions Precautions: Fall Recall of Precautions/Restrictions: Intact Restrictions Weight Bearing Restrictions Per Provider Order: No       Mobility Bed Mobility Overal bed mobility: Needs Assistance             General bed mobility comments: NT, pt in recliner pre and post session    Transfers Overall transfer level: Needs assistance Equipment used: Rolling walker (2 wheels) Transfers: Sit to/from Stand, Bed to chair/wheelchair/BSC Sit to Stand: Min  assist           General transfer comment: MIN A from lower recliner, movements effortful with cues required for hand placement     Balance Overall balance assessment: Needs assistance Sitting-balance support: Feet supported Sitting balance-Leahy Scale: Good     Standing balance support: Bilateral upper extremity supported, Reliant on assistive device for balance, During functional activity Standing balance-Leahy Scale: Fair Standing balance comment: requires unilateral support                           ADL either performed or assessed with clinical judgement   ADL Overall ADL's : Needs assistance/impaired     Grooming: Wash/dry hands;Wash/dry face;Sitting;Set up Grooming Details (indicate cue type and reason): recliner level Upper Body Bathing: Sitting;Set up;Cueing for sequencing Upper Body Bathing Details (indicate cue type and reason): recliner level, cues for sequencing Lower Body Bathing: Moderate assistance;Sit to/from stand;Cueing for safety;Cueing for sequencing Lower Body Bathing Details (indicate cue type and reason): recliner from STS; pt with decreased standing tolerance. cues for sequencing and unilateral support on RW Upper Body Dressing : Sitting;Minimal assistance Upper Body Dressing Details (indicate cue type and reason): minA, cues for IV line awareness Lower Body Dressing: Maximal assistance;Sit to/from stand;Cueing for sequencing;Cueing for safety Lower Body Dressing Details (indicate cue type and reason): doff/don underwear             Functional mobility during ADLs: Minimal assistance;Cueing for sequencing;Rolling walker (2 wheels);Cueing for safety General ADL Comments: UB/LB bathing + dressing from STS recliner level.     Communication Communication Communication: No apparent difficulties   Cognition Arousal: Alert Behavior During Therapy:  Anxious Cognition: Cognition impaired       Memory impairment (select all impairments):  Short-term memory Attention impairment (select first level of impairment): Selective attention Executive functioning impairment (select all impairments): Problem solving OT - Cognition Comments: requires increased time for processing (pt aware of this). mildly anxious, often asks therapist to repeat questions or directions. poor problem-solving, benefits from cues for sequencing                 Following commands: Intact        Cueing   Cueing Techniques: Verbal cues        General Comments DOE, VSS on RA. Potential SOB due to anxiety, denies dizziness    Pertinent Vitals/ Pain       Pain Assessment Pain Assessment: No/denies pain   Frequency  Min 3X/week        Progress Toward Goals  OT Goals(current goals can now be found in the care plan section)  Progress towards OT goals: Progressing toward goals  Acute Rehab OT Goals OT Goal Formulation: With patient Time For Goal Achievement: 06/02/24 Potential to Achieve Goals: Good ADL Goals Pt Will Perform Grooming: with modified independence;sitting;standing Pt Will Perform Lower Body Dressing: with modified independence;sitting/lateral leans;sit to/from stand Pt Will Transfer to Toilet: with modified independence;ambulating Pt Will Perform Toileting - Clothing Manipulation and hygiene: with modified independence;sitting/lateral leans;sit to/from stand  Plan         AM-PAC OT 6 Clicks Daily Activity     Outcome Measure   Help from another person eating meals?: None Help from another person taking care of personal grooming?: None Help from another person toileting, which includes using toliet, bedpan, or urinal?: A Little Help from another person bathing (including washing, rinsing, drying)?: A Lot Help from another person to put on and taking off regular upper body clothing?: None Help from another person to put on and taking off regular lower body clothing?: A Lot 6 Click Score: 19    End of Session  Equipment Utilized During Treatment: Rolling walker (2 wheels)  OT Visit Diagnosis: Other abnormalities of gait and mobility (R26.89);Muscle weakness (generalized) (M62.81)   Activity Tolerance Patient tolerated treatment well   Patient Left in chair;with call bell/phone within reach;with chair alarm set   Nurse Communication Mobility status        Time: 8843-8773 OT Time Calculation (min): 30 min  Charges: OT General Charges $OT Visit: 1 Visit OT Treatments $Self Care/Home Management : 23-37 mins  Brigitte Soderberg L. Leina Babe, OTR/L  05/20/24, 1:32 PM

## 2024-05-21 DIAGNOSIS — E871 Hypo-osmolality and hyponatremia: Secondary | ICD-10-CM | POA: Diagnosis not present

## 2024-05-21 DIAGNOSIS — L0390 Cellulitis, unspecified: Secondary | ICD-10-CM | POA: Diagnosis not present

## 2024-05-21 DIAGNOSIS — N179 Acute kidney failure, unspecified: Secondary | ICD-10-CM | POA: Diagnosis not present

## 2024-05-21 DIAGNOSIS — R627 Adult failure to thrive: Secondary | ICD-10-CM | POA: Diagnosis not present

## 2024-05-21 LAB — BASIC METABOLIC PANEL WITH GFR
Anion gap: 9 (ref 5–15)
BUN: 9 mg/dL (ref 8–23)
CO2: 21 mmol/L — ABNORMAL LOW (ref 22–32)
Calcium: 7.8 mg/dL — ABNORMAL LOW (ref 8.9–10.3)
Chloride: 108 mmol/L (ref 98–111)
Creatinine, Ser: 0.85 mg/dL (ref 0.44–1.00)
GFR, Estimated: >60 mL/min (ref >60)
Glucose, Bld: 92 mg/dL (ref 70–99)
Potassium: 4.2 mmol/L (ref 3.5–5.1)
Sodium: 138 mmol/L (ref 135–145)

## 2024-05-21 LAB — PHOSPHORUS: Phosphorus: 3.3 mg/dL (ref 2.5–4.6)

## 2024-05-21 LAB — MAGNESIUM: Magnesium: 2 mg/dL (ref 1.7–2.4)

## 2024-05-21 MED ORDER — ACETAMINOPHEN 325 MG PO TABS: 650.0000 mg | ORAL_TABLET | Freq: Four times a day (QID) | ORAL | 0 refills | Status: DC | PRN

## 2024-05-21 MED ORDER — POLYSACCHARIDE IRON COMPLEX 150 MG PO CAPS: 150.0000 mg | ORAL_CAPSULE | Freq: Every day | ORAL | 1 refills | Status: DC

## 2024-05-21 MED ORDER — MIRTAZAPINE 15 MG PO TABS: 15.0000 mg | ORAL_TABLET | Freq: Every day | ORAL | 0 refills | Status: DC

## 2024-05-21 MED ORDER — MEDIHONEY WOUND/BURN DRESSING EX PSTE: 1.0000 | PASTE | Freq: Every day | CUTANEOUS | 0 refills | Status: DC

## 2024-05-21 MED ORDER — CYANOCOBALAMIN 1000 MCG PO TABS: 1000.0000 ug | ORAL_TABLET | Freq: Every day | ORAL | 1 refills | Status: DC

## 2024-05-21 MED ORDER — TRAMADOL HCL 50 MG PO TABS: 50.0000 mg | ORAL_TABLET | Freq: Two times a day (BID) | ORAL | 0 refills | Status: DC | PRN

## 2024-05-21 MED ORDER — ORAL CARE MOUTH RINSE: 15.0000 mL | OROMUCOSAL | Status: DC | PRN

## 2024-05-21 MED ORDER — FLUCONAZOLE 100 MG PO TABS: 100.0000 mg | ORAL_TABLET | Freq: Every day | ORAL | 0 refills | Status: DC

## 2024-05-21 MED ORDER — CEPHALEXIN 500 MG PO CAPS: 500.0000 mg | ORAL_CAPSULE | Freq: Four times a day (QID) | ORAL | 0 refills | Status: DC

## 2024-05-21 MED ORDER — ADULT MULTIVITAMIN W/MINERALS CH: 1.0000 | ORAL_TABLET | Freq: Every day | ORAL | 1 refills | Status: DC

## 2024-05-21 MED ORDER — FOLIC ACID 1 MG PO TABS: 1.0000 mg | ORAL_TABLET | Freq: Every day | ORAL | 1 refills | Status: DC

## 2024-05-21 MED ORDER — VITAMIN D3 25 MCG PO TABS: 1000.0000 [IU] | ORAL_TABLET | Freq: Every day | ORAL | 1 refills | Status: DC

## 2024-05-21 MED ORDER — VITAMIN B-1 100 MG PO TABS: 100.0000 mg | ORAL_TABLET | Freq: Every day | ORAL | 1 refills | Status: DC

## 2024-05-21 MED ORDER — ENSURE PLUS HIGH PROTEIN PO LIQD: 237.0000 mL | Freq: Two times a day (BID) | ORAL | 0 refills | Status: DC

## 2024-05-21 MED ORDER — NYSTATIN 100000 UNIT/GM EX POWD: Freq: Three times a day (TID) | CUTANEOUS | 0 refills | Status: DC

## 2024-05-21 MED ORDER — PANTOPRAZOLE SODIUM 40 MG PO TBEC: 40.0000 mg | DELAYED_RELEASE_TABLET | Freq: Every day | ORAL | 1 refills | Status: DC

## 2024-05-21 NOTE — Progress Notes (Addendum)
 Mobility Specialist Progress Note:    05/21/24 1416  Mobility  Activity Ambulated with assistance;Pivoted/transferred to/from [facility]  Level of Assistance Minimal assist, patient does 75% or more  Assistive Device Front wheel walker  Distance Ambulated (ft) 35 ft  Range of Motion/Exercises Active;All extremities  Activity Response Tolerated well  Mobility visit 1 Mobility  Mobility Specialist Start Time (ACUTE ONLY) 1340  Mobility Specialist Stop Time (ACUTE ONLY) 1400  Mobility Specialist Time Calculation (min) (ACUTE ONLY) 20 min   Pt agreeable to mobility. Required MinA to stand and CGA to ambulate with RW. Tolerated well, c/o BLE pain d/t ulcers. Returned to chair, alarm on. Belongings in reach, all needs met.  Sherrilee Ditty Mobility Specialist Please contact via Special educational needs teacher or  Rehab office at [phone]

## 2024-05-21 NOTE — Progress Notes (Signed)
 Physical Therapy Treatment Patient Details Name: Cathy Grant MRN: [id number] DOB: 06-30-1961 Today's Date: 05/21/2024   History of Present Illness Pt is a 63 year old female admitted with starvation ketoacidosis,  severe hypoalbuminemia,  electrolyte abnormality with low potassium, low magnesium , severe iron  deficiency anemia, severe vitamin D  def, large intra-thoracic hiatal hernia/GERD,  poor appetite, severe intertrigo, bnilateral LE ulcers, abnormal CT chest. PMH significant for HTN and GERD.    PT Comments  Pt was pleasant and motivated to participate during the session and put forth good effort throughout. Pt required no physical assistance during the session but did require extra time and effort with functional tasks along with cues for general sequencing.  Pt was able to amb 2 x 30 feet this session with slow cadence but was generally steady with no overt LOB.  Pt reported no adverse symptoms during the session with SpO2 and HR WNL throughout on room air.  Pt will benefit from continued PT services upon discharge to safely address deficits listed in patient problem list for decreased caregiver assistance and eventual return to PLOF.       If plan is discharge home, recommend the following: A lot of help with bathing/dressing/bathroom;A little help with walking and/or transfers;Assistance with cooking/housework;Assist for transportation;Help with stairs or ramp for entrance   Can travel by private vehicle     Yes  Equipment Recommendations  Other (comment) (TBD at next venue of care)    Recommendations for Other Services       Precautions / Restrictions Precautions Precautions: Fall Recall of Precautions/Restrictions: Intact Restrictions Weight Bearing Restrictions Per Provider Order: No     Mobility  Bed Mobility               General bed mobility comments: NT, pt in recliner pre and post session    Transfers Overall transfer level: Needs assistance Equipment  used: Rolling walker (2 wheels) Transfers: Sit to/from Stand Sit to Stand: Contact guard assist           General transfer comment: Min verbal cues for hand placement and for positioning before attempting to sit    Ambulation/Gait Ambulation/Gait assistance: Contact guard assist Gait Distance (Feet): 30 Feet x 2 Assistive device: Rolling walker (2 wheels) Gait Pattern/deviations: Step-through pattern, Decreased step length - right, Decreased step length - left, Trunk flexed Gait velocity: decreased     General Gait Details: Very slow cadence with verbal cues for amb closer to the RW and withing the RW during sharp turns, generally steady with no overt LOB   Stairs             Wheelchair Mobility     Tilt Bed    Modified Rankin (Stroke Patients Only)       Balance           Standing balance support: Bilateral upper extremity supported, Reliant on assistive device for balance, During functional activity Standing balance-Leahy Scale: Fair                              Hotel manager: No apparent difficulties  Cognition Arousal: Alert Behavior During Therapy: WFL for tasks assessed/performed   PT - Cognitive impairments: No apparent impairments                         Following commands: Intact      Cueing Cueing Techniques: Verbal cues  Exercises Total  Joint Exercises Ankle Circles/Pumps: AROM, Strengthening, Both, 5 reps, 10 reps Quad Sets: Strengthening, Both, 5 reps, 10 reps Gluteal Sets: Strengthening, Both, 5 reps, 10 reps Long Arc Quad: Strengthening, Both, 10 reps, 15 reps Knee Flexion: Strengthening, Both, 10 reps, 15 reps Other Exercises Other Exercises: HEP education for BLE APs, QS, GS, and LAQs x 10-15 reps every 1-2 hours daily    General Comments        Pertinent Vitals/Pain Pain Assessment Pain Assessment: 0-10 Pain Score: 1  Pain Location: feet Pain Descriptors / Indicators:  Sore Pain Intervention(s): Monitored during session, Repositioned, Other (comment) (pt declined pain meds)    Home Living                          Prior Function            PT Goals (current goals can now be found in the care plan section) Progress towards PT goals: Progressing toward goals    Frequency    Min 2X/week      PT Plan      Co-evaluation              AM-PAC PT 6 Clicks Mobility   Outcome Measure  Help needed turning from your back to your side while in a flat bed without using bedrails?: A Lot Help needed moving from lying on your back to sitting on the side of a flat bed without using bedrails?: A Lot Help needed moving to and from a bed to a chair (including a wheelchair)?: A Lot Help needed standing up from a chair using your arms (e.g., wheelchair or bedside chair)?: A Little Help needed to walk in hospital room?: A Little Help needed climbing 3-5 steps with a railing? : A Lot 6 Click Score: 14    End of Session Equipment Utilized During Treatment: Gait belt Activity Tolerance: Patient tolerated treatment well Patient left: in chair;with call bell/phone within reach;with chair alarm set Nurse Communication: Mobility status PT Visit Diagnosis: Difficulty in walking, not elsewhere classified (R26.2);Muscle weakness (generalized) (M62.81);Dizziness and giddiness (R42);Other abnormalities of gait and mobility (R26.89)     Time: 8944-8879 PT Time Calculation (min) (ACUTE ONLY): 25 min  Charges:    $Gait Training: 8-22 mins $Therapeutic Exercise: 8-22 mins PT General Charges $$ ACUTE PT VISIT: 1 Visit                    D. Scott Javona Bergevin PT, DPT 05/21/24, 12:25 PM

## 2024-05-21 NOTE — Progress Notes (Signed)
 Occupational Therapy Treatment Patient Details Name: Cathy Grant MRN: [id number] DOB: 12/02/1960 Today's Date: 05/21/2024   History of present illness Pt is a 63 year old female admitted with starvation ketoacidosis,  severe hypoalbuminemia,  electrolyte abnormality with low potassium, low magnesium , severe iron  deficiency anemia, severe vitamin D  def, large intra-thoracic hiatal hernia/GERD,  poor appetite, severe intertrigo, bnilateral LE ulcers, abnormal CT chest. PMH significant for HTN and GERD.   OT comments  Pt participated in OT session focusing on ADL performance. Pt reports feeling fatigued after mobilizing with PT. Pt performs UB bathing and grooming tasks recliner level with setup, MIN A for dressing as pt has difficulties problem-solving to don gown. Pt benefits from frequent step-by-step sequencing cues for simple ADLs and appears anxious, fatiguing quickly and requiring several recovery breaks. Discharge recommendation appropriate, OT will continue to follow.       If plan is discharge home, recommend the following:  A little help with walking and/or transfers;A little help with bathing/dressing/bathroom;Help with stairs or ramp for entrance;Assistance with cooking/housework;Assist for transportation   Equipment Recommendations  BSC/3in1;Other (comment)    Recommendations for Other Services      Precautions / Restrictions Precautions Precautions: Fall Recall of Precautions/Restrictions: Intact Restrictions Weight Bearing Restrictions Per Provider Order: No       Mobility Bed Mobility               General bed mobility comments: NT    Transfers Overall transfer level: Needs assistance                 General transfer comment: NT, seated ADLs this session, pt able to scoot hips back in reclienr     Balance Overall balance assessment: Needs assistance Sitting-balance support: Feet supported Sitting balance-Leahy Scale: Good                                      ADL either performed or assessed with clinical judgement   ADL Overall ADL's : Needs assistance/impaired     Grooming: Wash/dry hands;Wash/dry face;Sitting;Set up;Applying deodorant Grooming Details (indicate cue type and reason): recliner level Upper Body Bathing: Sitting;Set up;Cueing for sequencing Upper Body Bathing Details (indicate cue type and reason): recliner level, cues for sequencing     Upper Body Dressing : Sitting;Minimal assistance Upper Body Dressing Details (indicate cue type and reason): cues for sequencing                   General ADL Comments: UB bathing recliner level, pt more anxious today vs yesterday. frequent recovery breaks for all seated ADLs     Communication Communication Communication: No apparent difficulties   Cognition Arousal: Alert Behavior During Therapy: Anxious Cognition: Cognition impaired     Awareness: Intellectual awareness impaired Memory impairment (select all impairments): Short-term memory Attention impairment (select first level of impairment): Selective attention Executive functioning impairment (select all impairments): Problem solving OT - Cognition Comments: requires increased time for processing (pt aware of this). mildly anxious, often asks therapist to repeat questions or directions. poor problem-solving, benefits from cues for sequencing                 Following commands: Intact        Cueing   Cueing Techniques: Verbal cues        General Comments VSS on RA    Pertinent Vitals/ Pain       Pain  Assessment Pain Assessment: No/denies pain Pain Score: 0-No pain         Frequency  Min 3X/week        Progress Toward Goals  OT Goals(current goals can now be found in the care plan section)  Progress towards OT goals: Progressing toward goals  Acute Rehab OT Goals OT Goal Formulation: With patient Time For Goal Achievement: 06/02/24 Potential to Achieve  Goals: Good ADL Goals Pt Will Perform Grooming: with modified independence;sitting;standing Pt Will Perform Lower Body Dressing: with modified independence;sitting/lateral leans;sit to/from stand Pt Will Transfer to Toilet: with modified independence;ambulating Pt Will Perform Toileting - Clothing Manipulation and hygiene: with modified independence;sitting/lateral leans;sit to/from stand  Plan         AM-PAC OT 6 Clicks Daily Activity     Outcome Measure   Help from another person eating meals?: None Help from another person taking care of personal grooming?: None Help from another person toileting, which includes using toliet, bedpan, or urinal?: A Little Help from another person bathing (including washing, rinsing, drying)?: A Lot Help from another person to put on and taking off regular upper body clothing?: None Help from another person to put on and taking off regular lower body clothing?: A Lot 6 Click Score: 19    End of Session    OT Visit Diagnosis: Other abnormalities of gait and mobility (R26.89);Muscle weakness (generalized) (M62.81)   Activity Tolerance Patient tolerated treatment well   Patient Left in chair;with call bell/phone within reach;with chair alarm set   Nurse Communication Mobility status        Time: 8862-8798 OT Time Calculation (min): 24 min  Charges: OT General Charges $OT Visit: 1 Visit OT Treatments $Self Care/Home Management : 23-37 mins  Vora Clover L. Ashleah Valtierra, OTR/L  05/21/24, 1:24 PM

## 2024-05-21 NOTE — Consult Note (Signed)
 PHARMACY CONSULT NOTE - ELECTROLYTES  Pharmacy Consult for Electrolyte Monitoring and Replacement   Recent Labs: Height: 5' 2 (157.5 cm) Weight: 74.8 kg (164 lb 14.5 oz) IBW/kg (Calculated) : 50.1 Estimated Creatinine Clearance: 64.2 mL/min (by C-G formula based on SCr of 0.85 mg/dL). Potassium (mmol/L)  Date Value  05/21/2024 4.2   Magnesium  (mg/dL)  Date Value  90/90/7974 2.0   Calcium (mg/dL)  Date Value  90/90/7974 7.8 (L)   Albumin (g/dL)  Date Value  90/91/7974 1.6 (L)   Phosphorus (mg/dL)  Date Value  90/90/7974 3.3   Sodium (mmol/L)  Date Value  05/21/2024 138   Corrected Ca: 9.8 mg/dL  Assessment  Cathy Grant is a 63 y.o. female presenting with primary problem of starvation ketoacidosis. PMH significant for essential hypertension, suspected breast cancer, and GERD. Patient mentioned having poor appetite at home. CT A/P showed hiatal hernia with anterior stomach. Pharmacy has been consulted to monitor and replace electrolytes.  Diet:  soft MIVF:none Pertinent medications: N/A  Goal of Therapy: Electrolytes WNL   Plan:  No electrolyte replacement indicated at this time Will check electrolytes in the AM   Thank you for allowing pharmacy to be a part of this patient's care.   Kayla Mose Niels DOUGLAS, PharmD, BCPS Clinical Pharmacist 05/21/2024

## 2024-05-21 NOTE — Progress Notes (Signed)
 [facility]  - [place] [facility]   PATIENT NAME: Cathy Grant    MR#:  [id number]  DATE OF BIRTH:  June 27, 1961  SUBJECTIVE:  patient attempting to work with PT when seen earlier. She overall looks better improving better and tolerating diet --soft patient ambulating with PT outside the room. Overall looks at feels better  VITALS:  Blood pressure 113/84, pulse 88, temperature 97.6 F (36.4 C), resp. rate 16, height 5' 2 (1.575 m), weight 74.8 kg, SpO2 100%.  PHYSICAL EXAMINATION:   GENERAL:  63 y.o.-year-old patient with no acute distress. obese LUNGS: Normal breath sounds bilaterally, no wheezing CARDIOVASCULAR: S1, S2 normal. No murmur   ABDOMEN: Soft, nontender, nondistended. Bowel sounds present.  Significant intertrigo--improving   EXTREMITIES: On admission   05/20/24    NEUROLOGIC: nonfocal  patient is alert and awake SKIN: as above  LABORATORY PANEL:  CBC Recent Labs  Lab 05/17/24 0530 05/18/24 0334 05/18/24 1227  WBC 8.5  --   --   HGB 5.5*   < > 10.2*  HCT 18.7*   < > 32.4*  PLT 523*  --   --    < > = values in this interval not displayed.    Chemistries  Recent Labs  Lab 05/17/24 0530 05/18/24 0334 05/21/24 0406  NA 132*   < > 138  K 2.8*   < > 4.2  CL 101   < > 108  CO2 22   < > 21*  GLUCOSE 110*   < > 92  BUN 24*   < > 9  CREATININE 1.19*   < > 0.85  CALCIUM 7.5*   < > 7.8*  MG  --    < > 2.0  AST 20  --   --   ALT 12  --   --   ALKPHOS 54  --   --   BILITOT 0.6  --   --    < > = values in this interval not displayed.   Cardiac Enzymes No results for input(s): TROPONINI in the last 168 hours. RADIOLOGY:  No results found.   Assessment and Plan Cathy Grant is a 63 y.o. female with medical history significant of essential hypertension,  GERD who has not been on any medications at home but has apparently been gradually declining not eating or drinking for weeks.    Starvation ketoacidosis severe  hypoalbuminemia electrolyte abnormality with low potassium, low magnesium , low phosphorus -- patient received IV fluids.-- Pharmacy to replace electrolytes -- dietitian to see patient -- multivitamin, PO thiamine , folate  Severe iron  deficiency anemia B12 deficiency borderline -- patient denies any melena or weight loss -- G.I. consultation with Dr. Maryruth -- came in with hemoglobin of 5.5-- will give 2 unit transfusion-- 10.2 -- IV iron  -- PO iron  -- follow-up [id number]--start po b12 tabs  Severe vitamin D  def --Vit D3--19.9--start cholecalciferol   Large intra-thoracic hiatal hernia/Gerd poor appetite -- start on Protonix  -- encourage full liquid diet --EGD No endoscopic esophageal abnormality to explain                         patient's dysphagia.                        - Large hiatal hernia.                        - Normal examined duodenum.                        -  No specimens collected. --per GI--inoperable and pt will not tolerate colon prep--consider out pt if needed  Severe Intertrigo (under the breast/groin) --poor hygiene/unable to care for self --Nystatin  powder --Diflucan  for 7 days +IV rocephin --now on Kelfex (2 days left)  Bilateral LE ulcers --follow WOC recs --On IV abxs  Abnormal CT chest--?left inferior breast skin thickening and bilateral LN enlargement --d/w Radiology--no breast mass seen on CT chest. Appears Reactive LN enlargement due to severe intertrigo --pt to get mammogram/US  breast as out pt  TOC for d/c planning PT/OT recommends rehab. TOC informed  Patient medically best at baseline for discharge. Discharge order placed. TOC aware. Patient agreeable to go to [facility] one-stop update  Procedures:EGD Family communication :none Consults : G.I. CODE STATUS: full DVT Prophylaxis :SCD (severe anemia) Level of care: Med-Surg Status is: Inpatient Remains inpatient appropriate because:awaiting Rehab bed  Pt medically best at  baseline for d/c    TOTAL TIME TAKING CARE OF THIS PATIENT: 35 minutes.  >50% time spent on counselling and coordination of care  Note: This dictation was prepared with Dragon dictation along with smaller phrase technology. Any transcriptional errors that result from this process are unintentional.  Leita Blanch M.D    [facility]   CC: Primary care physician; Patient, No Pcp Per

## 2024-05-21 NOTE — Discharge Instructions (Signed)
 Pt advised to establish PCP in the area

## 2024-05-21 NOTE — TOC Progression Note (Signed)
 Transition of Care [facility]) - Progression Note    Patient Details  Name: Cathy Grant MRN: [id number] Date of Birth: [birth year]  Transition of Care [facility] ) CM/SW Contact  Corean ONEIDA Haddock, RN Phone Number: 05/21/2024, 10:11 AM  Clinical Narrative:        [name] for [facility] pending                 Expected Discharge Plan and Services                                               Social Drivers of Health (SDOH) Interventions SDOH Screenings   Food Insecurity: Food Insecurity Present (05/18/2024)  Housing: High Risk (05/18/2024)  Transportation Needs: Unmet Transportation Needs (05/18/2024)  Utilities: Not At Risk (05/18/2024)  Tobacco Use: Low Risk  (05/16/2024)    Readmission Risk Interventions     No data to display

## 2024-05-22 DIAGNOSIS — E8729 Other acidosis: Secondary | ICD-10-CM | POA: Diagnosis not present

## 2024-05-22 DIAGNOSIS — T730XXA Starvation, initial encounter: Secondary | ICD-10-CM | POA: Diagnosis not present

## 2024-05-22 LAB — MAGNESIUM: Magnesium: 1.8 mg/dL (ref 1.7–2.4)

## 2024-05-22 LAB — PHOSPHORUS: Phosphorus: 2.6 mg/dL (ref 2.5–4.6)

## 2024-05-22 MED ORDER — MAGNESIUM SULFATE 2 GM/50ML IV SOLN
2.0000 g | Freq: Once | INTRAVENOUS | Status: AC
  Administered 2024-05-22: 2 g via INTRAVENOUS
  Filled 2024-05-22: qty 50

## 2024-05-22 NOTE — Progress Notes (Signed)
 Mobility Specialist Progress Note:    05/22/24 0810  Mobility  Activity Ambulated with assistance  Level of Assistance Standby assist, set-up cues, supervision of patient - no hands on  Assistive Device Front wheel walker  Distance Ambulated (ft) 40 ft  Range of Motion/Exercises Active;All extremities  Activity Response Tolerated well  Mobility visit 1 Mobility  Mobility Specialist Start Time (ACUTE ONLY) [id number]  Mobility Specialist Stop Time (ACUTE ONLY) 0810  Mobility Specialist Time Calculation (min) (ACUTE ONLY) 12 min   Pt received in chair, NT in room. Eager for mobility, SBA to stand and ambulate with RW. Tolerated well, asx throughout. Pt also independently performed exercises from PT. Ankle pumps and gluteal sets x5 bilaterally. Left in chair, alarm on and belongings in reach. All needs met.   Sherrilee Ditty Mobility Specialist Please contact via Special educational needs teacher or  Rehab office at [phone]

## 2024-05-22 NOTE — Progress Notes (Signed)
 Progress Note    Cathy Grant  FMW:[id number] DOB: 09/13/1960  DOA: 05/16/2024 PCP: Patient, No Pcp Per      Brief Narrative:    Medical records reviewed and are as summarized below:  Cathy Grant is a 63 y.o. female with medical history significant for hypertension, GERD, not on any home medications, who presented to the hospital because of poor oral intake.  She had not been eating or drinking properly for weeks and her condition has been gradually declining.  She has a complaint of neck pain because she says she had been sitting in the chair for too long.  She was found to have starvation ketoacidosis and multiple electrolyte abnormalities.      Assessment/Plan:   Principal Problem:   Starvation ketoacidosis Active Problems:   Essential hypertension   Normocytic anemia   Leucocytosis   AKI (acute kidney injury) (HCC)   Hyponatremia   FTT (failure to thrive) in adult   Cellulitis   Hypokalemia    Body mass index is 30.16 kg/m.   Starvation ketoacidosis, severe hypoalbuminemia: Continue nutritional supplements.  Encourage adequate oral intake.  Continue multivitamin, thiamine  and folate.   Hypokalemia, hypomagnesemia and hypophosphatemia: Improved   Severe iron  deficiency anemia, low normal vitamin B12 (342): S/p transfusion with 2 units of PRBCs.  Hemoglobin stable around 10.  Hemoglobin was 5.5 on admission. Continue vitamin B12 supplement and iron  supplement.  S/p EGD on 05/18/2024 which showed large hiatal hernia.  Outpatient follow-up recommended.   Severe intertrigo under breasts and groin: Continue nystatin  powder   Bilateral lower extremity ulcers: Completed antibiotics.  Continue local wound care.   Abnormal CT chest--?left inferior breast skin thickening and bilateral LN enlargement.  Per radiologist, no breast mass was seen on CT chest.  This appears to be reactive lymphadenopathy probably from severe intertrigo. Outpatient  mammogram/breast ultrasound recommended.   General weakness: PT recommended discharge to SNF.    Diet Order             Diet - low sodium heart healthy           DIET SOFT Room service appropriate? Yes; Fluid consistency: Thin  Diet effective now                                  Consultants: Gastroenterologist  Procedures: EGD    Medications:    cholecalciferol   1,000 Units Oral Daily   vitamin B-12  1,000 mcg Oral Daily   feeding supplement  237 mL Oral BID BM   fluconazole   100 mg Oral Daily   folic acid   1 mg Oral Daily   iron  polysaccharides  150 mg Oral Daily   leptospermum manuka honey  1 Application Topical Daily   mirtazapine   15 mg Oral QHS   multivitamin with minerals  1 tablet Oral Daily   nystatin    Topical TID   pantoprazole   40 mg Oral Daily   senna-docusate  2 tablet Oral QHS   thiamine   100 mg Oral Daily   Continuous Infusions:   Anti-infectives (From admission, onward)    Start     Dose/Rate Route Frequency Ordered Stop   05/22/24 0000  fluconazole  (DIFLUCAN ) 100 MG tablet        100 mg Oral Daily 05/21/24 1113 05/24/24 2359   05/21/24 0000  cephALEXin  (KEFLEX ) 500 MG capsule        500 mg Oral  Every 6 hours 05/21/24 1113 05/22/24 2359   05/20/24 1215  cephALEXin  (KEFLEX ) capsule 500 mg        500 mg Oral Every 6 hours 05/20/24 1118 05/22/24 0614   05/17/24 1600  fluconazole  (DIFLUCAN ) tablet 100 mg        100 mg Oral Daily 05/17/24 1503 05/24/24 0959   05/17/24 0600  metroNIDAZOLE  (FLAGYL ) IVPB 500 mg  Status:  Discontinued        500 mg 100 mL/hr over 60 Minutes Intravenous Every 12 hours 05/16/24 1846 05/17/24 1135   05/16/24 2200  cefTRIAXone  (ROCEPHIN ) 2 g in sodium chloride  0.9 % 100 mL IVPB  Status:  Discontinued        2 g 200 mL/hr over 30 Minutes Intravenous Every 24 hours 05/16/24 1846 05/20/24 1118   05/16/24 1530  ceFEPIme  (MAXIPIME ) 2 g in sodium chloride  0.9 % 100 mL IVPB        2 g 200 mL/hr over 30  Minutes Intravenous  Once 05/16/24 1519 05/16/24 1833   05/16/24 1530  metroNIDAZOLE  (FLAGYL ) IVPB 500 mg        500 mg 100 mL/hr over 60 Minutes Intravenous  Once 05/16/24 1519 05/16/24 1934   05/16/24 1530  vancomycin  (VANCOCIN ) IVPB 1000 mg/200 mL premix  Status:  Discontinued        1,000 mg 200 mL/hr over 60 Minutes Intravenous  Once 05/16/24 1519 05/17/24 0758              Family Communication/Anticipated D/C date and plan/Code Status   DVT prophylaxis: Place and maintain sequential compression device Start: 05/17/24 1144     Code Status: Full Code  Family Communication: None Disposition Plan: Plan to discharge to SNF   Status is: Inpatient Remains inpatient appropriate because: Awaiting placement to SNF       Subjective:   Interval events noted.  She has no complaints.  Objective:    Vitals:   05/21/24 1615 05/21/24 2139 05/22/24 0451 05/22/24 0759  BP: 118/76 (!) 112/59 114/67 131/70  Pulse: 83 87 77 91  Resp: 16 20 16 15   Temp: 98.1 F (36.7 C) 98.2 F (36.8 C) (!) 97.3 F (36.3 C) 97.6 F (36.4 C)  TempSrc:  Oral Oral   SpO2: 100% 95% 100% 99%  Weight:      Height:       No data found.   Intake/Output Summary (Last 24 hours) at 05/22/2024 1131 Last data filed at 05/22/2024 0900 Gross per 24 hour  Intake 240 ml  Output --  Net 240 ml   Filed Weights   05/16/24 1858 05/16/24 2008  Weight: 74.8 kg 74.8 kg    Exam:  GEN: NAD SKIN: Erythematous maculopapular rash on upper chest and groin.  Ulcers on bilateral feet EYES: No pallor or icterus ENT: MMM CV: RRR PULM: CTA B ABD: soft, distended, NT, +BS CNS: AAO x 3, non focal EXT: Significant bilateral lower extremity edema.  No tenderness        Data Reviewed:   I have personally reviewed following labs and imaging studies:  Labs: Labs show the following:   Basic Metabolic Panel: Recent Labs  Lab 05/16/24 1340 05/17/24 0530 05/18/24 0334 05/19/24 0512  05/20/24 0359 05/21/24 0406 05/22/24 0401  NA 132* 132* 133* 138 135 138  --   K 4.5 2.8* 3.2* 3.5 4.4 4.2  --   CL 95* 101 105 109 108 108  --   CO2 14* 22 21* 23 21*  21*  --   GLUCOSE 91 110* 88 87 90 92  --   BUN 32* 24* 15 9 7* 9  --   CREATININE 1.45* 1.19* 1.01* 0.83 0.73 0.85  --   CALCIUM 8.8* 7.5* 7.6* 7.6* 7.7* 7.8*  --   MG 2.0  --   --  1.6* 2.2 2.0 1.8  PHOS  --   --   --   --  1.2* 3.3 2.6   GFR Estimated Creatinine Clearance: 64.2 mL/min (by C-G formula based on SCr of 0.85 mg/dL). Liver Function Tests: Recent Labs  Lab 05/16/24 1340 05/17/24 0530 05/20/24 0359  AST 33 20  --   ALT 17 12  --   ALKPHOS 71 54  --   BILITOT 1.6* 0.6  --   PROT 7.5 5.3*  --   ALBUMIN 2.8* 1.8* 1.6*   No results for input(s): LIPASE, AMYLASE in the last 168 hours. No results for input(s): AMMONIA in the last 168 hours. Coagulation profile Recent Labs  Lab 05/16/24 1657  INR 1.1    CBC: Recent Labs  Lab 05/16/24 1340 05/17/24 0530 05/18/24 0334 05/18/24 1227  WBC 17.2* 8.5  --   --   HGB 7.5* 5.5* 10.0* 10.2*  HCT 27.1* 18.7* 31.5* 32.4*  MCV 72.1* 69.0*  --   --   PLT 631* 523*  --   --    Cardiac Enzymes: No results for input(s): CKTOTAL, CKMB, CKMBINDEX, TROPONINI in the last 168 hours. BNP (last 3 results) No results for input(s): PROBNP in the last 8760 hours. CBG: No results for input(s): GLUCAP in the last 168 hours. D-Dimer: No results for input(s): DDIMER in the last 72 hours. Hgb A1c: No results for input(s): HGBA1C in the last 72 hours. Lipid Profile: No results for input(s): CHOL, HDL, LDLCALC, TRIG, CHOLHDL, LDLDIRECT in the last 72 hours. Thyroid function studies: No results for input(s): TSH, T4TOTAL, T3FREE, THYROIDAB in the last 72 hours.  Invalid input(s): FREET3 Anemia work up: No results for input(s): VITAMINB12, FOLATE, FERRITIN, TIBC, IRON , RETICCTPCT in the last 72  hours. Sepsis Labs: Recent Labs  Lab 05/16/24 1340 05/16/24 1657 05/16/24 2110 05/17/24 0530  WBC 17.2*  --   --  8.5  LATICACIDVEN  --  1.4 1.7  --     Microbiology Recent Results (from the past 240 hours)  Blood culture (routine x 2)     Status: None (Preliminary result)   Collection Time: 05/16/24  4:58 PM   Specimen: BLOOD  Result Value Ref Range Status   Specimen Description BLOOD BLOOD LEFT ARM  Final   Special Requests   Final    BOTTLES DRAWN AEROBIC AND ANAEROBIC Blood Culture adequate volume   Culture   Final    NO GROWTH 4 DAYS Performed at [facility], [address], [place], [place] [address]    Report Status PENDING  Incomplete  Blood culture (routine x 2)     Status: None (Preliminary result)   Collection Time: 05/16/24  9:10 PM   Specimen: BLOOD  Result Value Ref Range Status   Specimen Description BLOOD LEFT HAND  Final   Special Requests   Final    BOTTLES DRAWN AEROBIC ONLY Blood Culture results may not be optimal due to an inadequate volume of blood received in culture bottles   Culture   Final    NO GROWTH 4 DAYS Performed at [facility], [address], [place], [place] [address]  Report Status PENDING  Incomplete    Procedures and diagnostic studies:  No results found.             LOS: 6 days   Samnang Shugars  [facility]   Pager on [email or web address]. If 7PM-7AM, please contact night-coverage at [email or web address]     05/22/2024, 11:31 AM

## 2024-05-22 NOTE — Consult Note (Signed)
 PHARMACY CONSULT NOTE - ELECTROLYTES  Pharmacy Consult for Electrolyte Monitoring and Replacement   Recent Labs: Height: 5' 2 (157.5 cm) Weight: 74.8 kg (164 lb 14.5 oz) IBW/kg (Calculated) : 50.1 Estimated Creatinine Clearance: 64.2 mL/min (by C-G formula based on SCr of 0.85 mg/dL). Potassium (mmol/L)  Date Value  05/21/2024 4.2   Magnesium  (mg/dL)  Date Value  90/89/7974 1.8   Calcium (mg/dL)  Date Value  90/90/7974 7.8 (L)   Albumin (g/dL)  Date Value  90/91/7974 1.6 (L)   Phosphorus (mg/dL)  Date Value  90/89/7974 2.6   Sodium (mmol/L)  Date Value  05/21/2024 138    Assessment  Cathy Grant is a 63 y.o. female presenting with primary problem of starvation ketoacidosis. PMH significant for essential hypertension, suspected breast cancer, and GERD. Patient mentioned having poor appetite at home. CT A/P showed hiatal hernia with anterior stomach. Pharmacy has been consulted to monitor and replace electrolytes.  Diet:  soft MIVF:none Pertinent medications: N/A  Goal of Therapy: Electrolytes WNL   Plan:  Mg 1.8, give MagSulf 2 grams IV x 1  Will check electrolytes in the AM   Thank you for allowing pharmacy to be a part of this patient's care.   Kayla Mose Niels DOUGLAS, PharmD, BCPS Clinical Pharmacist 05/22/2024

## 2024-05-22 NOTE — Progress Notes (Signed)
 Occupational Therapy Treatment Patient Details Name: Cathy Grant MRN: [id number] DOB: [birth year] Today's Date: 05/22/2024   History of present illness Pt is a 63 year old female admitted with starvation ketoacidosis,  severe hypoalbuminemia,  electrolyte abnormality with low potassium, low magnesium , severe iron  deficiency anemia, severe vitamin D  def, large intra-thoracic hiatal hernia/GERD,  poor appetite, severe intertrigo, bnilateral LE ulcers, abnormal CT chest. PMH significant for HTN and GERD.   OT comments  Pt making progress towards goals, remains motivated to participate in ADL performance. Completes UB/LB bathing + dressing from STS using RW, improved standing tolerance to allow for OT to assist with combing hair in standing. UB bathing + grooming completed with setup, MIN A for UB/LB dressing (improved from prior session at MOD A), and is able to use seated figure four to don underwear. OT will follow acutely, discharge recommendation appropriate.      If plan is discharge home, recommend the following:  A little help with walking and/or transfers;A little help with bathing/dressing/bathroom;Help with stairs or ramp for entrance;Assistance with cooking/housework;Assist for transportation   Equipment Recommendations  BSC/3in1;Other (comment)       Precautions / Restrictions Precautions Precautions: Fall Recall of Precautions/Restrictions: Intact Restrictions Weight Bearing Restrictions Per Provider Order: No       Mobility Bed Mobility Overal bed mobility: Needs Assistance             General bed mobility comments: NT    Transfers Overall transfer level: Needs assistance Equipment used: Rolling walker (2 wheels) Transfers: Sit to/from Stand Sit to Stand: Contact guard assist           General transfer comment: 6x STS, improved standing tolerance     Balance Overall balance assessment: Needs assistance Sitting-balance support: Feet supported Sitting  balance-Leahy Scale: Good     Standing balance support: Bilateral upper extremity supported, Reliant on assistive device for balance, During functional activity Standing balance-Leahy Scale: Fair Standing balance comment: requires unilateral support                           ADL either performed or assessed with clinical judgement   ADL Overall ADL's : Needs assistance/impaired     Grooming: Wash/dry hands;Wash/dry face;Sitting;Set up;Applying deodorant;Brushing hair Grooming Details (indicate cue type and reason): recliner level, stands for OT to assist with combing hair MAXA Upper Body Bathing: Sitting;Set up;Cueing for sequencing Upper Body Bathing Details (indicate cue type and reason): recliner level, cues for sequencing Lower Body Bathing: Sit to/from stand;Cueing for safety;Cueing for sequencing;Minimal assistance Lower Body Bathing Details (indicate cue type and reason): improved from MOD A prior session, Upper Body Dressing : Sitting;Minimal assistance   Lower Body Dressing: Minimal assistance;Sit to/from stand;Cueing for sequencing;Cueing for safety Lower Body Dressing Details (indicate cue type and reason): doff/don underwear             Functional mobility during ADLs: Contact guard assist;Cueing for sequencing;Rolling walker (2 wheels);Cueing for safety      Extremity/Trunk Assessment              Vision       Perception     Praxis     Communication Communication Communication: No apparent difficulties   Cognition Arousal: Alert Behavior During Therapy: Anxious Cognition: Cognition impaired     Awareness: Intellectual awareness impaired Memory impairment (select all impairments): Short-term memory Attention impairment (select first level of impairment): Selective attention Executive functioning impairment (select all impairments): Problem solving OT -  Cognition Comments: requires increased time for processing (pt aware of this).  mildly anxious, often asks therapist to repeat questions or directions. poor problem-solving, benefits from cues for sequencing                 Following commands: Intact        Cueing   Cueing Techniques: Verbal cues        General Comments VSS on RA, anxiety improving from previous sessions. RN in room to assess skin.    Pertinent Vitals/ Pain       Pain Assessment Pain Assessment: No/denies pain Pain Score: 0-No pain   Frequency  Min 3X/week        Progress Toward Goals  OT Goals(current goals can now be found in the care plan section)  Progress towards OT goals: Progressing toward goals  Acute Rehab OT Goals OT Goal Formulation: With patient Time For Goal Achievement: 06/02/24 Potential to Achieve Goals: Good ADL Goals Pt Will Perform Grooming: with modified independence;sitting;standing Pt Will Perform Lower Body Dressing: with modified independence;sitting/lateral leans;sit to/from stand Pt Will Transfer to Toilet: with modified independence;ambulating Pt Will Perform Toileting - Clothing Manipulation and hygiene: with modified independence;sitting/lateral leans;sit to/from stand  Plan         AM-PAC OT 6 Clicks Daily Activity     Outcome Measure   Help from another person eating meals?: None Help from another person taking care of personal grooming?: None Help from another person toileting, which includes using toliet, bedpan, or urinal?: A Little Help from another person bathing (including washing, rinsing, drying)?: A Lot Help from another person to put on and taking off regular upper body clothing?: None Help from another person to put on and taking off regular lower body clothing?: A Lot 6 Click Score: 19    End of Session Equipment Utilized During Treatment: Rolling walker (2 wheels)  OT Visit Diagnosis: Other abnormalities of gait and mobility (R26.89);Muscle weakness (generalized) (M62.81)   Activity Tolerance Patient tolerated  treatment well   Patient Left in chair;with call bell/phone within reach;with chair alarm set   Nurse Communication Mobility status        Time: 9063-8993 OT Time Calculation (min): 30 min  Charges: OT General Charges $OT Visit: 1 Visit OT Treatments $Self Care/Home Management : 23-37 mins Nitza Schmid L. Flora Parks, OTR/L  05/22/24, 11:29 AM

## 2024-05-22 NOTE — Progress Notes (Signed)
 Entered room and introduced myself as RN who helps with admissions and discharges. Patient is in the recliner upon arrival.   Explained to patient has a bed at a facility tomorrow (05/23/24) and this means we hope she is stable and ready for discharge at that time. Patient agrees she hopes she is ready. Patient states none of her friends or family know she is leaving tomorrow, but she is happy to call around and try to find someone to take her to the facility tomorrow morning. Explained finding someone this afternoon/evening would make tomorrow morning a much smoother day. Patient voiced understanding and thanked this nurse for the information.  At this time, social barriers to discharge include finding a ride to her facility. Per Corean ICM, please notify her department if patient is unable to find a ride.

## 2024-05-22 NOTE — TOC Progression Note (Signed)
 Transition of Care [facility]) - Progression Note    Patient Details  Name: Cathy Grant MRN: [id number] Date of Birth: Jan 26, 1961  [facility] of [facility] CM/SW Contact  Corean ONEIDA Haddock, RN Phone Number: 05/22/2024, 9:01 AM  Clinical Narrative:     Message sent to [facility] with [facility] requesting update on auth status                     Expected Discharge Plan and Services         Expected Discharge Date: 05/21/24                                     Social Drivers of Health (SDOH) Interventions SDOH Screenings   Food Insecurity: Food Insecurity Present (05/18/2024)  Housing: High Risk (05/18/2024)  Transportation Needs: Unmet Transportation Needs (05/18/2024)  Utilities: Not At Risk (05/18/2024)  Tobacco Use: Low Risk  (05/16/2024)    Readmission Risk Interventions     No data to display

## 2024-05-23 DIAGNOSIS — T730XXA Starvation, initial encounter: Secondary | ICD-10-CM | POA: Diagnosis not present

## 2024-05-23 DIAGNOSIS — E8729 Other acidosis: Secondary | ICD-10-CM | POA: Diagnosis not present

## 2024-05-23 LAB — BASIC METABOLIC PANEL WITH GFR
Anion gap: 4 — ABNORMAL LOW (ref 5–15)
BUN: 14 mg/dL (ref 8–23)
CO2: 23 mmol/L (ref 22–32)
Calcium: 7.6 mg/dL — ABNORMAL LOW (ref 8.9–10.3)
Chloride: 108 mmol/L (ref 98–111)
Creatinine, Ser: 0.93 mg/dL (ref 0.44–1.00)
GFR, Estimated: >60 mL/min (ref >60)
Glucose, Bld: 101 mg/dL — ABNORMAL HIGH (ref 70–99)
Potassium: 4.7 mmol/L (ref 3.5–5.1)
Sodium: 135 mmol/L (ref 135–145)

## 2024-05-23 LAB — PHOSPHORUS: Phosphorus: 2.7 mg/dL (ref 2.5–4.6)

## 2024-05-23 LAB — MAGNESIUM: Magnesium: 2.1 mg/dL (ref 1.7–2.4)

## 2024-05-23 MED ORDER — ACETAMINOPHEN 325 MG PO TABS: 650.0000 mg | ORAL_TABLET | Freq: Four times a day (QID) | ORAL | Status: AC | PRN

## 2024-05-23 MED ORDER — MEDIHONEY WOUND/BURN DRESSING EX PSTE: 1.0000 | PASTE | Freq: Every day | CUTANEOUS | Status: AC

## 2024-05-23 MED ORDER — MIRTAZAPINE 15 MG PO TABS: 15.0000 mg | ORAL_TABLET | Freq: Every day | ORAL | Status: AC

## 2024-05-23 MED ORDER — VITAMIN B-1 100 MG PO TABS: 100.0000 mg | ORAL_TABLET | Freq: Every day | ORAL | Status: AC

## 2024-05-23 MED ORDER — FOLIC ACID 1 MG PO TABS: 1.0000 mg | ORAL_TABLET | Freq: Every day | ORAL | Status: AC

## 2024-05-23 MED ORDER — NYSTATIN 100000 UNIT/GM EX POWD: Freq: Three times a day (TID) | CUTANEOUS | Status: AC

## 2024-05-23 MED ORDER — PANTOPRAZOLE SODIUM 40 MG PO TBEC: 40.0000 mg | DELAYED_RELEASE_TABLET | Freq: Every day | ORAL | Status: AC

## 2024-05-23 MED ORDER — VITAMIN D3 25 MCG PO TABS: 1000.0000 [IU] | ORAL_TABLET | Freq: Every day | ORAL | Status: AC

## 2024-05-23 MED ORDER — ADULT MULTIVITAMIN W/MINERALS CH: 1.0000 | ORAL_TABLET | Freq: Every day | ORAL | Status: AC

## 2024-05-23 MED ORDER — CYANOCOBALAMIN 1000 MCG PO TABS: 1000.0000 ug | ORAL_TABLET | Freq: Every day | ORAL | Status: AC

## 2024-05-23 MED ORDER — ENSURE PLUS HIGH PROTEIN PO LIQD: 237.0000 mL | Freq: Two times a day (BID) | ORAL | Status: AC

## 2024-05-23 MED ORDER — POLYSACCHARIDE IRON COMPLEX 150 MG PO CAPS: 150.0000 mg | ORAL_CAPSULE | Freq: Every day | ORAL | Status: AC

## 2024-05-23 NOTE — Consult Note (Signed)
 PHARMACY CONSULT NOTE - ELECTROLYTES  Pharmacy Consult for Electrolyte Monitoring and Replacement   Recent Labs: Height: 5' 2 (157.5 cm) Weight: 74.8 kg (164 lb 14.5 oz) IBW/kg (Calculated) : 50.1 Estimated Creatinine Clearance: 58.6 mL/min (by C-G formula based on SCr of 0.93 mg/dL). Potassium (mmol/L)  Date Value  05/23/2024 4.7   Magnesium  (mg/dL)  Date Value  90/88/7974 2.1   Calcium (mg/dL)  Date Value  90/88/7974 7.6 (L)   Albumin (g/dL)  Date Value  90/91/7974 1.6 (L)   Phosphorus (mg/dL)  Date Value  90/88/7974 2.7   Sodium (mmol/L)  Date Value  05/23/2024 135    Assessment  Cathy Grant is a 63 y.o. female presenting with primary problem of starvation ketoacidosis. PMH significant for essential hypertension, suspected breast cancer, and GERD. Patient mentioned having poor appetite at home. CT A/P showed hiatal hernia with anterior stomach. Pharmacy has been consulted to monitor and replace electrolytes.  Diet:  soft MIVF:none Pertinent medications: N/A  Goal of Therapy: Electrolytes WNL   Plan:  No electrolyte replacement warranted for today Patient is being discharged: no further electrolyte labs ordered  Thank you for allowing pharmacy to be a part of this patient's care.   Adriana Bolster, PharmD, BCPS Clinical Pharmacist 05/23/2024

## 2024-05-23 NOTE — Discharge Summary (Signed)
 Physician Discharge Summary   Patient: Cathy Grant MRN: [id number] DOB: 06-Dec-1960  Admit date:     05/16/2024  Discharge date: 05/23/24  Discharge Physician: AIDA CHO   PCP: Patient, No Pcp Per   Recommendations at discharge:   Follow-up with physician at the nursing home within 3 days of discharge Establish care with a primary care physician for routine health maintenance Mammogram/breast ultrasound recommended for age-appropriate breast cancer screening Outpatient follow-up with gastroenterologist recommended for consideration of colonoscopy as part of evaluation of iron  deficiency anemia  Discharge Diagnoses: Principal Problem:   Starvation ketoacidosis Active Problems:   Essential hypertension   Normocytic anemia   Leucocytosis   AKI (acute kidney injury) (HCC)   Hyponatremia   FTT (failure to thrive) in adult   Cellulitis   Hypokalemia  Resolved Problems:   Metastasis from breast cancer [facility])  Hospital Course:  Cathy Grant is a 63 y.o. female with medical history significant for hypertension, GERD, not on any home medications, who presented to the hospital because of poor oral intake.  She had not been eating or drinking properly for weeks and her condition has been gradually declining.  She has a complaint of neck pain because she says she had been sitting in the chair for too long.   She was found to have starvation ketoacidosis and multiple electrolyte abnormalities.   Assessment and Plan:  Starvation ketoacidosis, severe hypoalbuminemia: Continue nutritional supplements.  Encourage adequate oral intake.  Continue multivitamin, thiamine  and folate.     Hypokalemia, hypomagnesemia and hypophosphatemia: Improved     Severe iron  deficiency anemia, low normal vitamin B12 (342): S/p transfusion with 2 units of PRBCs.  Hemoglobin stable around 10.  Hemoglobin was 5.5 on admission. Continue vitamin B12 supplement and iron  supplement.   S/p EGD on  05/18/2024 which showed large hiatal hernia.  Outpatient follow-up with gastroenterologist recommended for evaluation for colonoscopy.     Severe intertrigo under breasts and groin: Continue nystatin  powder     Bilateral lower extremity ulcers: Completed antibiotics.  Continue local wound care.     Abnormal CT chest--?left inferior breast skin thickening and bilateral LN enlargement.  Per radiologist, no breast mass was seen on CT chest.  This appears to be reactive lymphadenopathy probably from severe intertrigo. Outpatient mammogram/breast ultrasound recommended.     General weakness: PT recommended discharge to SNF.            Consultants: Gastroenterologist Procedures performed: EGD Disposition: Skilled nursing facility Diet recommendation:  Discharge Diet Orders (From admission, onward)     Start     Ordered   05/21/24 0000  Diet - low sodium heart healthy        05/21/24 1113           Cardiac diet DISCHARGE MEDICATION: Allergies as of 05/23/2024   No Known Allergies      Medication List     TAKE these medications    acetaminophen  325 MG tablet Commonly known as: TYLENOL  Take 2 tablets (650 mg total) by mouth every 6 (six) hours as needed for moderate pain (pain score 4-6) (headache).   cyanocobalamin  1000 MCG tablet Take 1 tablet (1,000 mcg total) by mouth daily.   feeding supplement Liqd Take 237 mLs by mouth 2 (two) times daily between meals.   folic acid  1 MG tablet Commonly known as: FOLVITE  Take 1 tablet (1 mg total) by mouth daily.   iron  polysaccharides 150 MG capsule Commonly known as: NIFEREX Take 1 capsule (150 mg  total) by mouth daily.   leptospermum manuka honey Pste paste Apply 1 Application topically daily.   mirtazapine  15 MG tablet Commonly known as: REMERON  Take 1 tablet (15 mg total) by mouth at bedtime.   multivitamin with minerals Tabs tablet Take 1 tablet by mouth daily.   nystatin  powder Commonly known as:  MYCOSTATIN /NYSTOP  Apply topically 3 (three) times daily for 5 days.   pantoprazole  40 MG tablet Commonly known as: PROTONIX  Take 1 tablet (40 mg total) by mouth daily.   thiamine  100 MG tablet Commonly known as: Vitamin B-1 Take 1 tablet (100 mg total) by mouth daily.   vitamin D3 25 MCG tablet Commonly known as: CHOLECALCIFEROL  Take 1 tablet (1,000 Units total) by mouth daily.               Discharge Care Instructions  (From admission, onward)           Start     Ordered   05/23/24 0000  Discharge wound care:       Comments: Wound care  Daily      Comments: Apply Medihoney to left and right anterior feet and right leg wounds Q day, then cover with foam dressings.  Change foam dressings Q 3 days or PRN soiling   05/23/24 0914   05/21/24 0000  Discharge wound care:       Comments: 05/20/24 1000    Wound care  Daily      Comments: Apply Medihoney to left and right anterior feet and right leg wounds Q day, then cover with foam dressings.  Change foam dressings Q 3 days or PRN soiling  05/20/24 0540   05/21/24 1113            Contact information for after-discharge care     Destination     [facility] .   Service: Skilled Nursing Contact information: [address] [place]  [address] [phone]                    Discharge Exam: Cathy Grant   05/16/24 1858 05/16/24 2008  Weight: 74.8 kg 74.8 kg   GEN: NAD SKIN: Erythematous maculopapular rash on upper chest and groin area EYES: No pallor or icterus ENT: MMM CV: RRR PULM: CTA B ABD: soft, distended, NT, +BS CNS: AAO x 3, non focal EXT: Bilateral leg edema.  No tenderness.  Bilateral feet wounds.   Condition at discharge: stable  The results of significant diagnostics from this hospitalization (including imaging, microbiology, ancillary and laboratory) are listed below for reference.   Imaging  Studies: CT ABDOMEN PELVIS W CONTRAST Result Date: 05/16/2024 CLINICAL DATA:  Provided history: Abdominal pain, acute, nonlocalized Eighty weakness.  Decreased p.o. intake. EXAM: CT ABDOMEN AND PELVIS WITH CONTRAST TECHNIQUE: Multidetector CT imaging of the abdomen and pelvis was performed using the standard protocol following bolus administration of intravenous contrast. RADIATION DOSE REDUCTION: This exam was performed according to the departmental dose-optimization program which includes automated exposure control, adjustment of the mA and/or kV according to patient size and/or use of iterative reconstruction technique. CONTRAST:  80mL OMNIPAQUE  IOHEXOL  350 MG/ML SOLN COMPARISON:  None Available. FINDINGS: Lower chest: Assessed on concurrent chest CT, reported separately. Hepatobiliary: Motion artifact limitations. Question hepatic steatosis. No evidence of hepatic lesion. Partially distended gallbladder, motion obscured. No calcified gallstone. No biliary dilatation. Pancreas: No ductal dilatation or inflammation. The distal body and tail of the pancreas extend into large hiatal  hernia. No obvious pancreatic mass, motion obscures the pancreatic head. Spleen: No splenomegaly. 8 mm subcapsular hypodensity in the lateral spleen series 2, image 24, partially motion obscured. Adrenals/Urinary Tract: No adrenal nodule. No hydronephrosis or renal calculi. Bilateral parapelvic cysts. No evidence of renal inflammation or suspicious renal lesion. Unremarkable urinary bladder. Stomach/Bowel: Bowel assessment is limited in the absence of enteric contrast, as well as motion artifact in the upper abdomen. Large hiatal hernia, the entire stomach is intrathoracic. Fluid within nondilated small bowel. There is fluid throughout the colon. Occasional areas of small bowel and colonic enhancement suspected, but no convincing wall thickening. Normal appendix. Vascular/Lymphatic: Normal caliber abdominal aorta. Portal vein is patent.  No enlarged lymph nodes in the abdomen or pelvis. All lymph nodes are subcentimeter short axis and not enlarged by size criteria. Reproductive: Posterior uterine body mass measuring 7 x 7.6 x 5.6 cm typical of fibroid, although nonspecific. Atrophic ovaries, normal for age. Other: No ascites. No free air or omental thickening. No abdominal wall or inguinal hernia. There is skin thickening and increased density in the inferior left breast, series 2, image 30. Musculoskeletal: The bones are subjectively under mineralized. Lower lumbar facet hypertrophy. IMPRESSION: 1. Fluid throughout the small bowel and colon with occasional areas of enhancement. Findings may represent enterocolitis in the appropriate clinical setting. 2. Large hiatal hernia, the entire stomach is intrathoracic. Hiatal hernia also contains the pancreatic body and tail. 3. Skin thickening and increased density in the inferior left breast. Recommend correlation with physical exam and mammography. 4. Posterior uterine body mass measuring 7 x 7.6 x 5.6 cm typical of fibroid, although nonspecific. 5. Subcentimeter subcapsular hypodensity in the lateral spleen, partially motion obscured. Etiology is indeterminate. Electronically Signed   By: Andrea Gasman M.D.   On: 05/16/2024 18:09   CT HEAD WO CONTRAST ( ) Result Date: 05/16/2024 CLINICAL DATA:  Bone lesion, cervical spine, incidental; Headache, fever EXAM: CT HEAD WITHOUT CONTRAST CT CERVICAL SPINE WITHOUT CONTRAST TECHNIQUE: Multidetector CT imaging of the head and cervical spine was performed following the standard protocol without intravenous contrast. Multiplanar CT image reconstructions of the cervical spine were also generated. RADIATION DOSE REDUCTION: This exam was performed according to the departmental dose-optimization program which includes automated exposure control, adjustment of the mA and/or kV according to patient size and/or use of iterative reconstruction technique. COMPARISON:   None Available. FINDINGS: CT HEAD FINDINGS Brain: No evidence of large-territorial acute infarction. No parenchymal hemorrhage. No mass lesion. No extra-axial collection. No mass effect or midline shift. No hydrocephalus. Basilar cisterns are patent. Vascular: No hyperdense vessel. Skull: No acute fracture or focal lesion. Sinuses/Orbits: Paranasal sinuses and mastoid air cells are clear. The orbits are unremarkable. Other: None. CT CERVICAL SPINE FINDINGS Alignment: Normal. Skull base and vertebrae: Multilevel mild degenerative changes of the spine. No acute fracture. No aggressive appearing focal osseous lesion or focal pathologic process. Soft tissues and spinal canal: No prevertebral fluid or swelling. No visible canal hematoma. Upper chest: Unremarkable. Other: None. IMPRESSION: 1. No acute intracranial abnormality. 2. No acute displaced fracture or traumatic listhesis of the cervical spine. Electronically Signed   By: Morgane  Naveau M.D.   On: 05/16/2024 18:02   CT Cervical Spine Wo Contrast Result Date: 05/16/2024 CLINICAL DATA:  Bone lesion, cervical spine, incidental; Headache, fever EXAM: CT HEAD WITHOUT CONTRAST CT CERVICAL SPINE WITHOUT CONTRAST TECHNIQUE: Multidetector CT imaging of the head and cervical spine was performed following the standard protocol without intravenous contrast. Multiplanar CT image reconstructions of  the cervical spine were also generated. RADIATION DOSE REDUCTION: This exam was performed according to the departmental dose-optimization program which includes automated exposure control, adjustment of the mA and/or kV according to patient size and/or use of iterative reconstruction technique. COMPARISON:  None Available. FINDINGS: CT HEAD FINDINGS Brain: No evidence of large-territorial acute infarction. No parenchymal hemorrhage. No mass lesion. No extra-axial collection. No mass effect or midline shift. No hydrocephalus. Basilar cisterns are patent. Vascular: No hyperdense  vessel. Skull: No acute fracture or focal lesion. Sinuses/Orbits: Paranasal sinuses and mastoid air cells are clear. The orbits are unremarkable. Other: None. CT CERVICAL SPINE FINDINGS Alignment: Normal. Skull base and vertebrae: Multilevel mild degenerative changes of the spine. No acute fracture. No aggressive appearing focal osseous lesion or focal pathologic process. Soft tissues and spinal canal: No prevertebral fluid or swelling. No visible canal hematoma. Upper chest: Unremarkable. Other: None. IMPRESSION: 1. No acute intracranial abnormality. 2. No acute displaced fracture or traumatic listhesis of the cervical spine. Electronically Signed   By: Morgane  Naveau M.D.   On: 05/16/2024 18:02   CT Angio Chest PE W and/or Wo Contrast Result Date: 05/16/2024 CLINICAL DATA:  Provided history: Pulmonary embolism (PE) suspected, high prob Weakness.  Decreased p.o. intake. EXAM: CT ANGIOGRAPHY CHEST WITH CONTRAST TECHNIQUE: Multidetector CT imaging of the chest was performed using the standard protocol during bolus administration of intravenous contrast. Multiplanar CT image reconstructions and MIPs were obtained to evaluate the vascular anatomy. RADIATION DOSE REDUCTION: This exam was performed according to the departmental dose-optimization program which includes automated exposure control, adjustment of the mA and/or kV according to patient size and/or use of iterative reconstruction technique. CONTRAST:  80mL OMNIPAQUE  IOHEXOL  350 MG/ML SOLN COMPARISON:  Chest radiograph 01/11/2020, no interval imaging available. FINDINGS: Cardiovascular: There are no filling defects within the pulmonary arteries to suggest pulmonary embolus. The heart is normal in size. No pericardial effusion. Aortic atherosclerosis crash that mild aortic tortuosity, no aneurysm or acute aortic findings. Mediastinum/Nodes: Large hiatal hernia, the entire stomach is intrathoracic. There is an air-fluid level noted in the intrathoracic stomach.  No enlarged mediastinal or hilar lymph nodes. There is bilateral low axillary adenopathy, more so on the left. 13 mm left axillary node series 4, image 52. 14 mm left axillary node series 4, image 48. There is a prominent 9 mm left subpectoral node series 4, image 25. Largest lymph node on the right is in the axilla, measures 10 mm, series 4, image 40. there prominent right subpectoral nodes. Lungs/Pleura: Large left hiatal hernia with adjacent compressive atelectasis. No confluent consolidation. No significant pleural effusion. No features of pulmonary edema. No pulmonary mass or suspicious nodule. Upper Abdomen: Assessed on concurrent abdominopelvic CT, reported separately. Musculoskeletal: Minimally displaced right lateral third rib fracture. Moderate T5 superior endplate compression fracture. Moderate diffuse thoracic spondylosis with anterior spurring. On concurrent abdominopelvic CT there is skin thickening and increased density in the inferior left breast, but no obvious breast mass. Review of the MIP images confirms the above findings. IMPRESSION: 1. No pulmonary embolus. 2. Large hiatal hernia, the entire stomach is intrathoracic. 3. Bilateral axillary and subpectoral adenopathy, more so on the left. This is nonspecific. Differential considerations include reactive nodes, lymphoproliferative disorder such as lymphoma and metastatic disease. Skin thickening and increased density seen in the inferior left breast on concurrent abdominopelvic CT, but no obvious breast mass. Recommend up-to-date mammography. 4. Minimally displaced right lateral third rib fracture. 5. Moderate T5 superior endplate compression fracture, age indeterminate. Aortic Atherosclerosis (  ICD10-I70.0). Electronically Signed   By: Andrea Gasman M.D.   On: 05/16/2024 18:01   DG Foot 2 Views Left Result Date: 05/16/2024 CLINICAL DATA:  Osteomyelitis evaluation EXAM: LEFT FOOT - 2 VIEW COMPARISON:  None Available. FINDINGS: No acute  fractures are identified. No osseous erosions. Contraction deformity involving the second toe. Soft tissue swelling about the forefoot. 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 IMPRESSION: No definite radiographic evidence for acute osteomyelitis. Contractures involving the toes and hallux valgus deformity. Electronically Signed   By: Michaeline Blanch M.D.   On: 05/16/2024 16:05   DG Foot 2 Views Right Result Date: 05/16/2024 CLINICAL DATA:  Evaluate for osteomyelitis EXAM: RIGHT FOOT - 2 VIEW COMPARISON:  None Available. FINDINGS: No acute fractures are identified. No osseous erosions. Likely plantar dislocation of the second digit middle phalanx at the PIP joint. Marked metatarsus primus varus and hallux valgus deformity at the first MTP joint. Soft tissue swelling about the forefoot. IMPRESSION: 1. Plantar dislocation of the second digit middle phalanx at the PIP joint. 2.  Metatarsus primus varus and hallux valgus. 3.  No definite radiographic evidence for acute osteomyelitis. Electronically Signed   By: Michaeline Blanch M.D.   On: 05/16/2024 16:03    Microbiology: Results for orders placed or performed during the hospital encounter of 05/16/24  Blood culture (routine x 2)     Status: None (Preliminary result)   Collection Time: 05/16/24  4:58 PM   Specimen: BLOOD  Result Value Ref Range Status   Specimen Description BLOOD BLOOD LEFT ARM  Final   Special Requests   Final    BOTTLES DRAWN AEROBIC AND ANAEROBIC Blood Culture adequate volume   Culture   Final    NO GROWTH 4 DAYS Performed at [facility], [address] [place], [place] [address]    Report Status PENDING  Incomplete  Blood culture (routine x 2)     Status: None (Preliminary result)   Collection Time: 05/16/24  9:10 PM   Specimen: BLOOD  Result Value Ref Range Status   Specimen Description BLOOD LEFT HAND  Final   Special Requests   Final    BOTTLES DRAWN AEROBIC ONLY Blood Culture results may not be optimal  due to an inadequate volume of blood received in culture bottles   Culture   Final    NO GROWTH 4 DAYS Performed at [facility], [address] [place], [place] [address]    Report Status PENDING  Incomplete    Labs: CBC: Recent Labs  Lab 05/16/24 1340 05/17/24 0530 05/18/24 0334 05/18/24 1227  WBC 17.2* 8.5  --   --   HGB 7.5* 5.5* 10.0* 10.2*  HCT 27.1* 18.7* 31.5* 32.4*  MCV 72.1* 69.0*  --   --   PLT 631* 523*  --   --    Basic Metabolic Panel: Recent Labs  Lab 05/18/24 0334 05/19/24 0512 05/20/24 0359 05/21/24 0406 05/22/24 0401 05/23/24 0403  NA 133* 138 135 138  --  135  K 3.2* 3.5 4.4 4.2  --  4.7  CL 105 109 108 108  --  108  CO2 21* 23 21* 21*  --  23  GLUCOSE 88 87 90 92  --  101*  BUN 15 9 7* 9  --  14  CREATININE 1.01* 0.83 0.73 0.85  --  0.93  CALCIUM 7.6* 7.6* 7.7* 7.8*  --  7.6*  MG  --  1.6* 2.2 2.0 1.8 2.1  PHOS  --   --  1.2* 3.3 2.6 2.7   Liver Function Tests: Recent Labs  Lab 05/16/24 1340 05/17/24 0530 05/20/24 0359  AST 33 20  --   ALT 17 12  --   ALKPHOS 71 54  --   BILITOT 1.6* 0.6  --   PROT 7.5 5.3*  --   ALBUMIN 2.8* 1.8* 1.6*   CBG: No results for input(s): GLUCAP in the last 168 hours.  Discharge time spent: greater than 30 minutes.  Signed: AIDA CHO, MD [facility] 05/23/2024

## 2024-05-23 NOTE — Plan of Care (Signed)

## 2024-05-23 NOTE — TOC Transition Note (Signed)
 Transition of Care [facility]) - Discharge Note   Patient Details  Name: Cathy Grant MRN: [id number] Date of Birth: 1961/09/02  [facility] CM/SW Contact:  Corean ONEIDA Haddock, RN Phone Number: 05/23/2024, 1:01 PM   Clinical Narrative:     [name] received for [facility]  Patient will DC to: [facility] Anticipated DC date: 05/23/24 Transport by: per bedside RN patient has called a friend to transport her at 1pm   Per MD patient ready for DC to . RN, patient, , and facility notified of DC. Discharge Summary sent to facility. RN given number for report. DC packet on chart.    TOC signing off.        Patient Goals and CMS Choice            Discharge Placement                       Discharge Plan and Services Additional resources added to the After Visit Summary for                                       Social Drivers of Health (SDOH) Interventions SDOH Screenings   Food Insecurity: Food Insecurity Present (05/18/2024)  Housing: High Risk (05/18/2024)  Transportation Needs: Unmet Transportation Needs (05/18/2024)  Utilities: Not At Risk (05/18/2024)  Tobacco Use: Low Risk  (05/16/2024)     Readmission Risk Interventions     No data to display

## 2024-05-24 LAB — CULTURE, BLOOD (ROUTINE X 2)
Culture: NO GROWTH
Culture: NO GROWTH
Special Requests: ADEQUATE

## 2024-07-04 ENCOUNTER — Ambulatory Visit: Payer: Self-pay

## 2024-07-04 NOTE — Telephone Encounter (Signed)
 Unable to reach patient for triage x 3 attempts.

## 2024-07-04 NOTE — Telephone Encounter (Signed)
  Message from [place] sent at 07/04/2024 10:47 AM EDT  Reason for Triage: pt is unaware of what to do about medication she doesn't have a pcp and no appointments available until dec. Please follow up with pt at [phone]

## 2024-07-09 ENCOUNTER — Encounter: Admitting: Physician Assistant

## 2024-08-06 ENCOUNTER — Encounter: Admitting: Physician Assistant
